# Patient Record
Sex: Female | Born: 1955 | Race: White | Hispanic: No | Marital: Married | Smoking: Never smoker
Health system: Southern US, Community
[De-identification: ages and names within clinical notes are randomized; demographics above are authoritative.]

## PROBLEM LIST (undated history)

## (undated) DIAGNOSIS — M1990 Unspecified osteoarthritis, unspecified site: Secondary | ICD-10-CM

## (undated) DIAGNOSIS — T8859XA Other complications of anesthesia, initial encounter: Secondary | ICD-10-CM

## (undated) DIAGNOSIS — T4145XA Adverse effect of unspecified anesthetic, initial encounter: Secondary | ICD-10-CM

## (undated) DIAGNOSIS — R112 Nausea with vomiting, unspecified: Secondary | ICD-10-CM

## (undated) DIAGNOSIS — R011 Cardiac murmur, unspecified: Secondary | ICD-10-CM

## (undated) DIAGNOSIS — Z9889 Other specified postprocedural states: Secondary | ICD-10-CM

## (undated) DIAGNOSIS — I10 Essential (primary) hypertension: Secondary | ICD-10-CM

## (undated) HISTORY — PX: NOSE SURGERY: SHX723

## (undated) HISTORY — DX: Essential (primary) hypertension: I10

## (undated) HISTORY — PX: ABDOMINAL HYSTERECTOMY: SHX81

## (undated) HISTORY — DX: Unspecified osteoarthritis, unspecified site: M19.90

## (undated) HISTORY — DX: Cardiac murmur, unspecified: R01.1

---
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
## 1983-04-15 HISTORY — PX: DILATION AND CURETTAGE OF UTERUS: SHX78

## 1995-04-15 HISTORY — PX: BACK SURGERY: SHX140

## 1996-06-12 HISTORY — PX: SPINE SURGERY: SHX786

## 1999-04-24 ENCOUNTER — Ambulatory Visit (HOSPITAL_COMMUNITY): Admission: RE | Admit: 1999-04-24 | Discharge: 1999-04-24 | Payer: Self-pay | Admitting: Obstetrics and Gynecology

## 1999-04-24 ENCOUNTER — Encounter: Payer: Self-pay | Admitting: Obstetrics and Gynecology

## 1999-11-25 ENCOUNTER — Other Ambulatory Visit: Admission: RE | Admit: 1999-11-25 | Discharge: 1999-11-25 | Payer: Self-pay | Admitting: Obstetrics and Gynecology

## 2000-04-14 LAB — HM MAMMOGRAPHY: HM Mammogram: NORMAL

## 2000-08-11 ENCOUNTER — Other Ambulatory Visit: Admission: RE | Admit: 2000-08-11 | Discharge: 2000-08-11 | Payer: Self-pay | Admitting: Obstetrics and Gynecology

## 2001-09-12 ENCOUNTER — Encounter (INDEPENDENT_AMBULATORY_CARE_PROVIDER_SITE_OTHER): Payer: Self-pay | Admitting: Internal Medicine

## 2001-09-12 LAB — CONVERTED CEMR LAB: Pap Smear: NORMAL

## 2001-11-23 ENCOUNTER — Other Ambulatory Visit: Admission: RE | Admit: 2001-11-23 | Discharge: 2001-11-23 | Payer: Self-pay | Admitting: Obstetrics and Gynecology

## 2003-12-14 ENCOUNTER — Other Ambulatory Visit: Admission: RE | Admit: 2003-12-14 | Discharge: 2003-12-14 | Payer: Self-pay | Admitting: Obstetrics and Gynecology

## 2004-08-09 ENCOUNTER — Ambulatory Visit: Payer: Self-pay | Admitting: Family Medicine

## 2004-08-19 ENCOUNTER — Ambulatory Visit: Payer: Self-pay | Admitting: Family Medicine

## 2004-09-18 ENCOUNTER — Ambulatory Visit: Payer: Self-pay | Admitting: Family Medicine

## 2004-10-25 ENCOUNTER — Ambulatory Visit: Payer: Self-pay | Admitting: Family Medicine

## 2005-04-24 ENCOUNTER — Ambulatory Visit: Payer: Self-pay | Admitting: Family Medicine

## 2005-06-16 ENCOUNTER — Ambulatory Visit: Payer: Self-pay | Admitting: Family Medicine

## 2005-06-30 ENCOUNTER — Ambulatory Visit: Payer: Self-pay | Admitting: Family Medicine

## 2005-07-17 ENCOUNTER — Ambulatory Visit: Payer: Self-pay | Admitting: Family Medicine

## 2005-07-28 ENCOUNTER — Other Ambulatory Visit: Admission: RE | Admit: 2005-07-28 | Discharge: 2005-07-28 | Payer: Self-pay | Admitting: Obstetrics and Gynecology

## 2007-11-04 ENCOUNTER — Telehealth (INDEPENDENT_AMBULATORY_CARE_PROVIDER_SITE_OTHER): Payer: Self-pay | Admitting: Internal Medicine

## 2007-12-15 ENCOUNTER — Telehealth (INDEPENDENT_AMBULATORY_CARE_PROVIDER_SITE_OTHER): Payer: Self-pay | Admitting: Internal Medicine

## 2007-12-15 ENCOUNTER — Encounter (INDEPENDENT_AMBULATORY_CARE_PROVIDER_SITE_OTHER): Payer: Self-pay | Admitting: Internal Medicine

## 2007-12-15 DIAGNOSIS — I10 Essential (primary) hypertension: Secondary | ICD-10-CM | POA: Insufficient documentation

## 2007-12-28 ENCOUNTER — Ambulatory Visit: Payer: Self-pay | Admitting: Family Medicine

## 2007-12-31 ENCOUNTER — Ambulatory Visit: Payer: Self-pay | Admitting: Family Medicine

## 2008-01-03 ENCOUNTER — Ambulatory Visit: Payer: Self-pay | Admitting: Family Medicine

## 2008-01-03 DIAGNOSIS — M719 Bursopathy, unspecified: Secondary | ICD-10-CM

## 2008-01-03 DIAGNOSIS — M19019 Primary osteoarthritis, unspecified shoulder: Secondary | ICD-10-CM | POA: Insufficient documentation

## 2008-01-03 DIAGNOSIS — M67919 Unspecified disorder of synovium and tendon, unspecified shoulder: Secondary | ICD-10-CM | POA: Insufficient documentation

## 2008-01-04 LAB — CONVERTED CEMR LAB
ALT: 27 units/L (ref 0–35)
AST: 27 units/L (ref 0–37)
Albumin: 4 g/dL (ref 3.5–5.2)
Alkaline Phosphatase: 46 units/L (ref 39–117)
BUN: 13 mg/dL (ref 6–23)
Basophils Absolute: 0.1 10*3/uL (ref 0.0–0.1)
Basophils Relative: 1.3 % (ref 0.0–3.0)
Bilirubin, Direct: 0.1 mg/dL (ref 0.0–0.3)
CO2: 28 meq/L (ref 19–32)
Calcium: 9.2 mg/dL (ref 8.4–10.5)
Chloride: 109 meq/L (ref 96–112)
Cholesterol: 176 mg/dL (ref 0–200)
Creatinine, Ser: 0.7 mg/dL (ref 0.4–1.2)
Direct LDL: 105.5 mg/dL
Eosinophils Absolute: 0.3 10*3/uL (ref 0.0–0.7)
Eosinophils Relative: 4.9 % (ref 0.0–5.0)
GFR calc Af Amer: 114 mL/min
GFR calc non Af Amer: 94 mL/min
Glucose, Bld: 93 mg/dL (ref 70–99)
HCT: 35.5 % — ABNORMAL LOW (ref 36.0–46.0)
HDL: 36.8 mg/dL — ABNORMAL LOW (ref >39.0)
Hemoglobin: 12.2 g/dL (ref 12.0–15.0)
Lymphocytes Relative: 26.4 % (ref 12.0–46.0)
MCHC: 34.2 g/dL (ref 30.0–36.0)
MCV: 85.4 fL (ref 78.0–100.0)
Monocytes Absolute: 0.4 10*3/uL (ref 0.1–1.0)
Monocytes Relative: 7.5 % (ref 3.0–12.0)
Neutro Abs: 3.4 10*3/uL (ref 1.4–7.7)
Neutrophils Relative %: 59.9 % (ref 43.0–77.0)
Platelets: 281 10*3/uL (ref 150–400)
Potassium: 4.5 meq/L (ref 3.5–5.1)
RBC: 4.16 M/uL (ref 3.87–5.11)
RDW: 12.1 % (ref 11.5–14.6)
Sodium: 141 meq/L (ref 135–145)
TSH: 0.48 microintl units/mL (ref 0.35–5.50)
Total Bilirubin: 0.7 mg/dL (ref 0.3–1.2)
Total CHOL/HDL Ratio: 4.8
Total Protein: 6.9 g/dL (ref 6.0–8.3)
Triglycerides: 204 mg/dL (ref 0–149)
VLDL: 41 mg/dL — ABNORMAL HIGH (ref 0–40)
WBC: 5.7 10*3/uL (ref 4.5–10.5)

## 2008-02-08 ENCOUNTER — Ambulatory Visit: Payer: Self-pay | Admitting: Family Medicine

## 2008-06-27 ENCOUNTER — Ambulatory Visit: Payer: Self-pay | Admitting: Family Medicine

## 2008-06-27 ENCOUNTER — Encounter (INDEPENDENT_AMBULATORY_CARE_PROVIDER_SITE_OTHER): Payer: Self-pay | Admitting: *Deleted

## 2008-10-30 ENCOUNTER — Ambulatory Visit: Payer: Self-pay | Admitting: Family Medicine

## 2008-10-30 DIAGNOSIS — L551 Sunburn of second degree: Secondary | ICD-10-CM | POA: Insufficient documentation

## 2009-01-02 ENCOUNTER — Ambulatory Visit: Payer: Self-pay | Admitting: Family Medicine

## 2009-01-02 DIAGNOSIS — J309 Allergic rhinitis, unspecified: Secondary | ICD-10-CM | POA: Insufficient documentation

## 2009-01-12 ENCOUNTER — Telehealth: Payer: Self-pay | Admitting: Family Medicine

## 2009-01-31 ENCOUNTER — Encounter (INDEPENDENT_AMBULATORY_CARE_PROVIDER_SITE_OTHER): Payer: Self-pay | Admitting: *Deleted

## 2009-03-23 ENCOUNTER — Ambulatory Visit: Payer: Self-pay | Admitting: Family Medicine

## 2009-03-23 DIAGNOSIS — J069 Acute upper respiratory infection, unspecified: Secondary | ICD-10-CM | POA: Insufficient documentation

## 2010-01-22 ENCOUNTER — Telehealth: Payer: Self-pay | Admitting: Family Medicine

## 2010-01-28 ENCOUNTER — Telehealth: Payer: Self-pay | Admitting: Family Medicine

## 2010-05-16 NOTE — Progress Notes (Signed)
Summary: wants motion sickness patches  Phone Note Call from Patient Call back at Work Phone [phone]   Caller: Patient Summary of Call: Pt is going deep sea fishing and is asking for transderm scop patches to be [facility].  She only wants one patch, but I told her they come in a box of 4. Initial call taken by: Lowella Petties CMA,  January 22, 2010 3:05 PM  Follow-up for Phone Call        done Follow-up by: Crawford Givens MD,  January 22, 2010 3:27 PM    New/Updated Medications: TRANSDERM-SCOP 1.5 MG PT72 (SCOPOLAMINE BASE) 1 patch applied q72 hours for motion sickness Prescriptions: TRANSDERM-SCOP 1.5 MG PT72 (SCOPOLAMINE BASE) 1 patch applied q72 hours for motion sickness  #4 patches x 0   Entered and Authorized by:   Crawford Givens MD   Signed by:   Crawford Givens MD on 01/22/2010   Method used:   [facility]        [facility] [address] [facility]       [address]       [place], [place]  [address]       Ph: [phone] or [phone]       Fax: [phone]   RxID:   [id number]

## 2010-05-16 NOTE — Progress Notes (Signed)
Summary: Rx Lisinopril  Phone Note Refill Request Message from:  Fax from Pharmacy on January 28, 2010 9:20 AM  Refills Requested: Medication #1:  LISINOPRIL 10 MG TABS Take one by mouth daily   Supply Requested: 1 month PATIENT NOT SEEN IN OFFICE IN ALMOST A YEAR   [facility]   Method Requested: Electronic Initial call taken by: Benny Lennert CMA Duncan Dull),  January 28, 2010 9:21 AM  Follow-up for Phone Call        Have patient scheduled CPE here in the clinic.   Follow-up by: Crawford Givens MD,  January 28, 2010 9:38 AM  Additional Follow-up for Phone Call Additional follow up Details #1::        Patient notified as instructed by telephone. Transferred patient to the front office to get an  appointment scheduled. Sydell Axon LPN  January 28, 2010 12:01 PM  Additional Follow-up by: Sydell Axon LPN,  January 28, 2010 12:06 PM    Prescriptions: LISINOPRIL 10 MG TABS (LISINOPRIL) Take one by mouth daily  #90 x 0   Entered and Authorized by:   Crawford Givens MD   Signed by:   Crawford Givens MD on 01/28/2010   Method used:   [facility]        [facility] [address] [facility]       [address]       [place], [place]  [address]       Ph: [phone] or [phone]       Fax: [phone]   RxID:   [id number]

## 2010-06-05 ENCOUNTER — Ambulatory Visit (INDEPENDENT_AMBULATORY_CARE_PROVIDER_SITE_OTHER): Payer: BC Managed Care – PPO | Admitting: Family Medicine

## 2010-06-05 ENCOUNTER — Encounter: Payer: Self-pay | Admitting: Family Medicine

## 2010-06-05 DIAGNOSIS — Z1211 Encounter for screening for malignant neoplasm of colon: Secondary | ICD-10-CM

## 2010-06-05 DIAGNOSIS — M1990 Unspecified osteoarthritis, unspecified site: Secondary | ICD-10-CM

## 2010-06-05 DIAGNOSIS — I10 Essential (primary) hypertension: Secondary | ICD-10-CM

## 2010-06-11 NOTE — Assessment & Plan Note (Signed)
Summary: TRANSFER FROM [facility]  [employer]   Vital Signs:  Patient profile:   55 year old female Height:      63.5 inches Weight:      174.75 pounds BMI:     30.58 Temp:     98.0 degrees F oral Pulse rate:   66 / minute Pulse rhythm:   regular BP sitting:   140 / 70  (left arm) Cuff size:   regular  Vitals Entered By: Linde Gillis CMA Duncan Dull) (June 05, 2010 3:24 PM) CC: establish care from [place]   History of Present Illness: Hypertension:      Using medication without problems or lightheadedness: yes Chest pain with exertion:no Edema:no Short of breath:no Average home BPs: 120s SBP recently Other issues: no.  no cough.    OA- h/o back surgery.  Taking nsaids with food and this controls the pain.  Occ shoulder pain, L shoulder, at night.  She usually takes the diclofenac once daily, occ two times a day.  She occ uses ibuprofen for the shoulder pain, but she doesn't use the meds together.  No recent injury.  I talked to her about minimizing the nsaids and the caution re:ACE use.    Allergies (verified): No Known Drug Allergies  Past History:  Past Medical History: Hypertension Osteoarthritis- controlled with diclofenac, usually with single dose per day  Past Surgical History: D&C due to misscarriage--1985 septioplasty--1990 back surg  L5-S1--3/98   [facility] Bladder tack and hysterectomy planned- Dr. Senaida Ores  Family History: Reviewed history from 12/15/2007 and no changes required. father: D) age 82  MI, crohn's mother: D)  age 70 MI 0 brothers 0 sisters MGM D) MI, CAD Depression positive in father, mother, PGF  Social History: Reviewed history from 12/28/2007 and no changes required. Marital Status: Married, 1980 Children: 3--1 at home, no grandchildren Occupation: Youth worker, Medical laboratory scientific officer, works as Lawyer at [employer] no smoking no alcohol  no illicits enjoys gardening, fishing  Review of Systems       See HPI.   Otherwise negative.    Physical Exam  General:  GEN: nad, alert and oriented HEENT: mucous membranes moist NECK: supple w/o LA CV: rrr.  no murmur PULM: ctab, no inc wob ABD: soft, +bs EXT: no edema SKIN: no acute rash  L shoulder with normal ROM but some pain along the superior aspect of the scapula posteriorly, esp with ext/int rotation.    Impression & Recommendations:  Problem # 1:  OSTEOARTHRITIS (ICD-715.90) >25 min spent with patient, at least half of which was spent on counseling.  GI caution and ACE caution given.  return for labs and notify me if the pain increases.   Her updated medication list for this problem includes:    Diclofenac Sodium 75 Mg Tbec (Diclofenac sodium) .Marland Kitchen... Take 1 tablet by mouth two times a day as needed with food  Problem # 2:  HYPERTENSION (ICD-401.9) controlled, d/w patient [id number]/FAOZ.  Flu shot encouraged, declined.  Her updated medication list for this problem includes:    Lisinopril 10 Mg Tabs (Lisinopril) .Marland Kitchen... Take one by mouth daily  Problem # 3:  SCREENING, COLON CANCER (ICD-V76.51) d/w patient [id number] and she opts for IFOB.  No FH of colon CA.    Problem # 4:  Screening Breast Cancer (ICD-V76.10) per Gyn along with pap.   Complete Medication List: 1)  Lisinopril 10 Mg Tabs (Lisinopril) .... Take one by mouth daily 2)  Diclofenac Sodium 75 Mg Tbec (Diclofenac sodium) .Marland KitchenMarland KitchenMarland Kitchen  Take 1 tablet by mouth two times a day as needed with food  Patient Instructions: 1)  I was glad to see you today.  2)  Come back for fasting labs.  CMET/lipid 401.1.  You can get your results through our phone system.  Follow the instructions on the blue card.  3)  Office visit in 6months to check BP.  4)  I would get a flu shot.   5)  Take care.  Stay on your low salt diet and try to walk more for exercise.  Prescriptions: DICLOFENAC SODIUM 75 MG TBEC (DICLOFENAC SODIUM) Take 1 tablet by mouth two times a day as needed with food  #180 x 3    Entered and Authorized by:   Crawford Givens MD   Signed by:   Crawford Givens MD on 06/05/2010   Method used:   [facility]        [facility] [facility]       [address]       [place], [place]  [address]       Ph: [phone] or [phone]       Fax: [phone]   RxID:   [id number] LISINOPRIL 10 MG TABS (LISINOPRIL) Take one by mouth daily  #90 x 3   Entered and Authorized by:   Crawford Givens MD   Signed by:   Crawford Givens MD on 06/05/2010   Method used:   [facility]ronically [facility]        [facility] ([facility]ail)       [address]       [place], [place]  [address]       Ph: [phone] or [phone]       Fax: [phone]   RxID:   [id number]    Orders Added: 1)  Est. Patient Level IV [616[address]]    Current Allergies (reviewed today): No known allergies

## 2010-06-11 NOTE — Letter (Signed)
Summary: [facility]: Immunoassay Fecal Occult Blood (iFOB) Order Form  [place] [facility]  [address] [place], [place] [address]   Phone: [phone]  Fax: [phone]      [facility]: Immunoassay Fecal Occult Blood (iFOB) Order Form   June 05, 2010 MRN: [id number]   Miranda Willis Jan 11, 1956   Physicican Name:______duncan___________________  Diagnosis Code:________v76.49__________________      Crawford Givens MD

## 2010-06-12 ENCOUNTER — Encounter (INDEPENDENT_AMBULATORY_CARE_PROVIDER_SITE_OTHER): Payer: Self-pay | Admitting: *Deleted

## 2010-06-12 ENCOUNTER — Other Ambulatory Visit: Payer: Self-pay | Admitting: Family Medicine

## 2010-06-12 ENCOUNTER — Other Ambulatory Visit: Payer: BC Managed Care – PPO

## 2010-06-12 DIAGNOSIS — Z1289 Encounter for screening for malignant neoplasm of other sites: Secondary | ICD-10-CM

## 2010-06-13 ENCOUNTER — Encounter (INDEPENDENT_AMBULATORY_CARE_PROVIDER_SITE_OTHER): Payer: Self-pay | Admitting: *Deleted

## 2010-06-13 LAB — FECAL OCCULT BLOOD, IMMUNOCHEMICAL: Fecal Occult Bld: NEGATIVE

## 2010-06-20 NOTE — Letter (Signed)
Summary: Results Follow up Letter  [place] [facility]  [address] [place], [place] [address]   Phone: [phone]  Fax: [phone]    06/13/2010 MRN: [id number]    Miranda Willis [address] [facility] [place], [place]  [address]  [place]    Dear Ms. Reitz,  The following are the results of your recent test(s):  Test         Result    Pap Smear:        Normal _____  Not Normal _____ Comments: ______________________________________________________ Cholesterol: LDL(Bad cholesterol):         Your goal is less than:         HDL (Good cholesterol):       Your goal is more than: Comments:  ______________________________________________________ Mammogram:        Normal _____  Not Normal _____ Comments:  ___________________________________________________________________ Hemoccult:        Normal ___X__  Not normal _______ Comments:    _____________________________________________________________________ Other Tests:    We routinely do not discuss normal results over the telephone.  If you desire a copy of the results, or you have any questions about this information we can discuss them at your next office visit.   Sincerely,    Dwana Curd. Para March, M.D.  [facility]

## 2010-08-24 ENCOUNTER — Encounter: Payer: Self-pay | Admitting: Family Medicine

## 2010-08-24 ENCOUNTER — Encounter: Payer: Self-pay | Admitting: Internal Medicine

## 2010-08-24 ENCOUNTER — Ambulatory Visit (INDEPENDENT_AMBULATORY_CARE_PROVIDER_SITE_OTHER): Payer: BC Managed Care – PPO | Admitting: Internal Medicine

## 2010-08-24 VITALS — BP 180/90 | HR 80 | Temp 98.3°F | Ht 63.5 in | Wt 171.0 lb

## 2010-08-24 DIAGNOSIS — H8190 Unspecified disorder of vestibular function, unspecified ear: Secondary | ICD-10-CM

## 2010-08-24 DIAGNOSIS — R42 Dizziness and giddiness: Secondary | ICD-10-CM

## 2010-08-24 DIAGNOSIS — Z23 Encounter for immunization: Secondary | ICD-10-CM

## 2010-08-24 MED ORDER — MECLIZINE HCL 25 MG PO TABS: 25.0000 mg | ORAL_TABLET | Freq: Three times a day (TID) | ORAL | Status: AC | PRN

## 2010-08-24 NOTE — Progress Notes (Signed)
  Subjective:    Patient ID: Miranda Willis, female    DOB: 1955/08/13, 55 y.o.   MRN: [id number]  HPI Awoke this AM, "staggered to bathroom" Having dizziness and nausea Okay if lying down but can't really stand up Vomited several times this AM  Balance is off No sense of movement  No headache No fever Mild nasal and ear congestion Does have allergy problems---tried zyrtec this Am but no help  No tinnitus No decrease in hearing  Current outpatient prescriptions:cetirizine (ZYRTEC) 10 MG tablet, OTC as directed , Disp: , Rfl: ;  diclofenac (VOLTAREN) 75 MG EC tablet, Take 75 mg by mouth 2 (two) times daily. With food , Disp: , Rfl: ;  lisinopril (PRINIVIL,ZESTRIL) 10 MG tablet, Take 10 mg by mouth daily.  , Disp: , Rfl:   Past Medical History  Diagnosis Date  . Hypertension   . Osteoarthritis     Past Surgical History  Procedure Date  . Dilation and curettage of uterus 1985    due to miscarriage  . Spine surgery 06/1996    back surgery L5-S1 Dr Criss Alvine    Family History  Problem Relation Age of Onset  . Heart disease Mother     MI  . Depression Mother   . Heart disease Father     MI  . Depression Father   . Heart disease Maternal Grandmother     MI and CAD  . Depression Paternal Grandfather     History   Social History  . Marital Status: Married    Spouse Name: N/A    Number of Children: N/A  . Years of Education: N/A   Occupational History  . Not on file.   Social History Main Topics  . Smoking status: Never Smoker   . Smokeless tobacco: Not on file  . Alcohol Use: No  . Drug Use: No  . Sexually Active: Not on file   Other Topics Concern  . Not on file   Social History Narrative  . No narrative on file   Review of Systems No speech trouble No weakness or other neurologic symptoms Has had similar spells about 1 per year---better in past if she just stays in bed Not prone to motion sickness    Objective:   Physical Exam  Constitutional: She  is oriented to person, place, and time. She appears well-developed and well-nourished.       Lying on exam table Mildly uncomfortable--esp with movement  HENT:  Head: Normocephalic and atraumatic.  Right Ear: External ear normal.  Left Ear: External ear normal.  Mouth/Throat: Oropharynx is clear and moist. No oropharyngeal exudate.       TMs normal  Neck: Normal range of motion. No thyromegaly present.  Lymphadenopathy:    She has no cervical adenopathy.  Neurological: She is alert and oriented to person, place, and time. She has normal strength. She displays no tremor. No cranial nerve deficit or sensory deficit. She exhibits normal muscle tone. Gait abnormal.       Very mild swaying with walking  Psychiatric: She has a normal mood and affect. Her behavior is normal. Judgment and thought content normal.          Assessment & Plan:

## 2010-08-26 ENCOUNTER — Telehealth: Payer: Self-pay | Admitting: *Deleted

## 2010-08-26 NOTE — Telephone Encounter (Signed)
Noted.  Pt seen in Saturday clinic, thought to be vestibular cause of vertigo.

## 2010-08-26 NOTE — Telephone Encounter (Signed)
[facility] Triage Call Report Triage Record Num: [id number] Operator: Elita Boone Patient Name: Miranda Willis Call Date & Time: 08/24/2010 9:47:05AM Patient Phone: [phone] PCP: Crawford Givens Patient Gender: Female PCP Fax : Patient DOB: [birth year] Practice Name: [facility] Reason for Call: pt calling with report of dizzness and nausa. Pt reports it is only when she stands up. Pt reports that she is feeling congestion in ears. Onset 05/11. Pt reports that she took "allergy pill. " Pt given an appt today at [facility] office by Erie Noe at 1045. Home care advice given for dizziness. No emergent s/s. Protocol(s) Used: Dizziness or Vertigo Recommended Outcome per Protocol: See Provider within 4 hours Reason for Outcome: Vertigo with vomiting AND not responding to 4 hours of home care Care Advice: Call EMS 911 if patient develops confusion, decreased level of consciousness, chest pain, shortness of breath, or focal neurologic abnormalities such as facial droop or weakness of one extremity. ~ ~ DO NOT drive until condition evaluated. ~ Call provider if symptoms worsen or new symptoms develop. Avoid caffeine (coffee, tea, cola drinks, or chocolate), alcohol, and nicotine (use of tobacco), as use of these substances may worsen symptoms. ~ ~ SYMPTOM / CONDITION MANAGEMENT ~ CAUTIONS ~ List, or take, all current prescription(s), nonprescription or alternative medication(s) to provider for evaluation. Vomiting Care Advice: - Do not eat solid foods until vomiting subsides. - Begin taking fluids by sucking on ice chips or popsicles or taking sips of cool clear, nonprescription oral rehydration solution). - Gradually drink larger amounts of these fluids so that you are drinking six to eight 8 oz. (.2 liter) of fluids a day. - Keep activity to a minimum. - After vomiting subsides, eat smaller, more frequent meals of easily digested foods such as crackers, toast, bananas, rice, cooked  cereal, applesauce, broth, baked or mashed potatoes, chicken or [place] without skin. Eat slowly. - Take fluids 30 minutes before or 60 minutes after meals. - Avoid high fat, highly seasoned, high fiber or high sugar content foods. - Avoid extremely hot or cold foods. - Do not take pain medication (such as aspirin, NSAIDs) while nauseated or vomiting. - Consult your provider for advice regarding continuing prescription medication. - Rest as much as possible in a sitting or in a propped lying position. Do not lie flat for at least 2 hours after eating. ~ 08/24/2010 9:57:06AM Page 1 of 1 [id number]

## 2010-10-15 ENCOUNTER — Encounter: Payer: Self-pay | Admitting: Family Medicine

## 2010-10-15 ENCOUNTER — Ambulatory Visit (INDEPENDENT_AMBULATORY_CARE_PROVIDER_SITE_OTHER): Payer: BC Managed Care – PPO | Admitting: Family Medicine

## 2010-10-15 DIAGNOSIS — M25519 Pain in unspecified shoulder: Secondary | ICD-10-CM

## 2010-10-15 DIAGNOSIS — I10 Essential (primary) hypertension: Secondary | ICD-10-CM

## 2010-10-15 DIAGNOSIS — M25512 Pain in left shoulder: Secondary | ICD-10-CM | POA: Insufficient documentation

## 2010-10-15 MED ORDER — LISINOPRIL 10 MG PO TABS: 10.0000 mg | ORAL_TABLET | Freq: Every day | ORAL | Status: DC

## 2010-10-15 MED ORDER — DICLOFENAC SODIUM 75 MG PO TBEC: 75.0000 mg | DELAYED_RELEASE_TABLET | Freq: Every day | ORAL | Status: DC

## 2010-10-15 NOTE — Patient Instructions (Signed)
Call ortho about the appointment.  Don't change your meds.  Come back for fasting labs.  You can get your results through our phone system a few days after you get labs drawn.  Follow the instructions on the blue card. I would get a flu shot each fall.   Take care.

## 2010-10-15 NOTE — Assessment & Plan Note (Signed)
Controlled, return for labs.  No change in meds.

## 2010-10-15 NOTE — Progress Notes (Signed)
New Grandmother, Madison Hickman, born ~1.5 weeks ago.  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   I talked to her about getting a flu shot this fall.   Hypertension:    Using medication without problems or lightheadedness: yes Chest pain with exertion:no Edema:no Short of breath:no  She's due for labs, not fasting today.   OA: Still with shoulder pain.  S/p injection and PT for R shoulder, improved.  L shoulder and B elbow pain continues.  She has some intermittent position numbness in the L arm.  She's prev seen [facility]ia [facility]' office with ortho.  She'll call about that.  Taking diclofenac once a day and tolerated well.    Meds, vitals, and allergies reviewed.   ROS: See HPI.  Otherwise negative.    GEN: nad, alert and oriented NECK: supple w/o LA CV: rrr. PULM: ctab, no inc wob ABD: soft, +bs EXT: no edema SKIN: no acute rash L shoulder with normal ROM but pain on int rotation and + impingement

## 2010-10-15 NOTE — Assessment & Plan Note (Signed)
She'll call about f/u with ortho.  No change in meds in the meantime.

## 2010-10-17 ENCOUNTER — Other Ambulatory Visit (INDEPENDENT_AMBULATORY_CARE_PROVIDER_SITE_OTHER): Payer: BC Managed Care – PPO | Admitting: Family Medicine

## 2010-10-17 DIAGNOSIS — I10 Essential (primary) hypertension: Secondary | ICD-10-CM

## 2010-10-17 LAB — LIPID PANEL
HDL: 49 mg/dL (ref >39.00)
Total CHOL/HDL Ratio: 4
VLDL: 36.2 mg/dL (ref 0.0–40.0)

## 2010-10-17 LAB — COMPREHENSIVE METABOLIC PANEL
ALT: 24 U/L (ref 0–35)
AST: 22 U/L (ref 0–37)
Alkaline Phosphatase: 52 U/L (ref 39–117)
Potassium: 4 mEq/L (ref 3.5–5.1)
Sodium: 137 mEq/L (ref 135–145)
Total Bilirubin: 0.3 mg/dL (ref 0.3–1.2)
Total Protein: 7.2 g/dL (ref 6.0–8.3)

## 2010-11-29 ENCOUNTER — Ambulatory Visit (INDEPENDENT_AMBULATORY_CARE_PROVIDER_SITE_OTHER): Payer: BC Managed Care – PPO | Admitting: Family Medicine

## 2010-11-29 ENCOUNTER — Encounter: Payer: Self-pay | Admitting: Family Medicine

## 2010-11-29 VITALS — BP 140/80 | HR 80 | Temp 99.5°F | Wt 172.8 lb

## 2010-11-29 DIAGNOSIS — R21 Rash and other nonspecific skin eruption: Secondary | ICD-10-CM | POA: Insufficient documentation

## 2010-11-29 MED ORDER — FLUOCINONIDE 0.05 % EX CREA: TOPICAL_CREAM | CUTANEOUS | Status: DC

## 2010-11-29 MED ORDER — PERMETHRIN 5 % EX CREA: TOPICAL_CREAM | CUTANEOUS | Status: DC

## 2010-11-29 NOTE — Patient Instructions (Signed)
Scabies   Scabies are small bugs (mites) that burrow under the skin and cause red bumps and severe itching. These bugs can only be seen with a microscope.   Scabies are highly contagious. They can spread easily from person to person by direct contact. They are also spread through sharing clothing or linens that have the scabies mites living in them. It is not unusual for an entire family to become infected through shared towels, clothing, or bedding.   HOME CARE INSTRUCTIONS   Your caregiver may prescribe a cream or lotion to kill the mites. If this cream is prescribed; massage the cream into the entire area of the body from the neck to the bottom of both feet. Also massage the cream into the scalp and face if your child is less than 1 year old. Avoid the eyes and mouth.   Leave the cream on for 8 to12 hours. DO NOT wash your hands after application. Your child should bathe or shower after the 8 to 12 hour application period. Sometimes it is helpful to apply the cream to your child at right before bedtime.   One treatment is usually effective and will eliminate approximately 95% of infestations. For severe cases, your caregiver may decide to repeat the treatment in 1 week. Everyone in your household should be treated with one application of the cream.   New rashes or burrows should not appear after successful treatment within 24 to 48 hours; however the itching and rash may last for 2 to 4 weeks after successful treatment. If your symptoms persist longer than this, see your caregiver.   Your caregiver also may prescribe a medication to help with the itching or to help the rash go away more quickly.   Scabies can live on clothing or linens for up to 3 days. Your entire child’s recently used clothing, towels, stuffed toys, and bed linens should be washed in hot water and then dried in a dryer for at least 20 minutes on high heat. Items that cannot be washed should be enclosed in a plastic bag for at least 3 days.   To  help relieve itching, bathe your child in a COOL bath or apply cool washcloths to the affected areas.   Your child may return to school after treatment with the prescribed cream.   SEEK MEDICAL CARE IF:   The itching persists longer than 4 weeks after treatment.   The rash spreads or becomes infected (the area has red blisters or yellow-tan crust).   Document Released: 03/31/2005 Document Re-Released: 08/17/2008   ExitCare® Patient Information ©2011 [employer].

## 2010-11-29 NOTE — Progress Notes (Signed)
  Subjective:    Patient ID: Miranda Willis, female    DOB: 05/29/1955, 55 y.o.   MRN: [id number]  HPI  55 yo here for rash on right leg and stomach. Was in the garden a few days ago and started itching that night. Claritin and Zyrtec not helping.  Does have dogs at home.  Husband does not have any of these symptoms. No recent travel. No change in laundry detergent.  Rash is very itchy, not painful, feels like it is spreading up her right side of her body.  Patient Active Problem List  Diagnoses  . HYPERTENSION  . ALLERGIC RHINITIS  . DEGENERATIVE JOINT DISEASE, RIGHT SHOULDER  . ROTATOR CUFF SYNDROME, RIGHT  . OSTEOARTHRITIS  . Shoulder pain, left   Past Medical History  Diagnosis Date  . Hypertension   . Osteoarthritis    Past Surgical History  Procedure Date  . Dilation and curettage of uterus 1985    due to miscarriage  . Spine surgery 06/1996    back surgery L5-S1 Dr Criss Alvine  . Nose surgery    History  Substance Use Topics  . Smoking status: Never Smoker   . Smokeless tobacco: Never Used  . Alcohol Use: No   Family History  Problem Relation Age of Onset  . Heart disease Mother     MI  . Depression Mother   . Heart disease Father     MI  . Depression Father   . Heart disease Maternal Grandmother     MI and CAD  . Depression Paternal Grandfather    No Known Allergies Current Outpatient Prescriptions on File Prior to Visit  Medication Sig Dispense Refill  . cetirizine (ZYRTEC) 10 MG tablet OTC as directed       . diclofenac (VOLTAREN) 75 MG EC tablet Take 1 tablet (75 mg total) by mouth daily. With food  90 tablet  3  . lisinopril (PRINIVIL,ZESTRIL) 10 MG tablet Take 1 tablet (10 mg total) by mouth daily.  90 tablet  3      Review of Systems See HPI    Objective:   Physical Exam BP 140/80  Pulse 80  Temp 99.5 F (37.5 C)  Wt 172 lb 12 oz (78.359 kg) Gen:  Alert, pleasant, NAD HEENT: MMM Skin:  +erythematous papules on right leg, waist line  and back, no burrow markings Psych:  Alert, oriented, not anxious or depressed appearing     Assessment & Plan:   1. Rash    New.  Does seem consistent with scabies or allergic reaction to bug bites but no mites or burrow marks. Will treat with one dose permethrin, lidex cream as needed. Benadryl or Zyrtec as needed.

## 2011-03-12 ENCOUNTER — Telehealth: Payer: Self-pay | Admitting: *Deleted

## 2011-03-12 NOTE — Telephone Encounter (Signed)
LMOVM to return call.

## 2011-03-12 NOTE — Telephone Encounter (Signed)
Had had virus since Monday.  Does she need appt?  She is sick on stomach and can't eat a lot.  Please call patient back.

## 2011-03-12 NOTE — Telephone Encounter (Signed)
Pt c/o nausea since Monday, diarrhea Monday, and vomiting Monday and again today after eating.  She is taking Tyl for fever and took Claritin last night for sinus sx, both are helping. I advised clear liquids for 24 hours then slowly progress to [employer], and that I would check with you for any additional advise on otc meds etc.

## 2011-03-13 NOTE — Telephone Encounter (Signed)
LMOVM to return call.

## 2011-03-17 ENCOUNTER — Ambulatory Visit (INDEPENDENT_AMBULATORY_CARE_PROVIDER_SITE_OTHER): Payer: BC Managed Care – PPO | Admitting: Family Medicine

## 2011-03-17 ENCOUNTER — Encounter: Payer: Self-pay | Admitting: Family Medicine

## 2011-03-17 VITALS — BP 146/78 | HR 84 | Temp 99.5°F | Wt 173.2 lb

## 2011-03-17 DIAGNOSIS — J069 Acute upper respiratory infection, unspecified: Secondary | ICD-10-CM

## 2011-03-17 MED ORDER — GUAIFENESIN-CODEINE 100-10 MG/5ML PO SYRP: 5.0000 mL | ORAL_SOLUTION | Freq: Two times a day (BID) | ORAL | Status: AC | PRN

## 2011-03-17 NOTE — Assessment & Plan Note (Signed)
Of 1 d duration, not consistent with ILI. Anticipate last week illness viral gastro as vomiting resolved. Supportive care, cheratussin for cough.  Update [place] if sxs not improving.

## 2011-03-17 NOTE — Progress Notes (Signed)
  Subjective:    Patient ID: Miranda Willis, female    DOB: [birth year], 55 y.o.   MRN: [id number]  HPI CC: chest congestion  Last week had nausea/vomiting, fever 7days ago.  Stayed out of work for 3 days.  Returned to work today.  Now starting to have chest congestion, coughing productive of clear mucous, sharp HA which started yesterday.  Also with PNDrainage.  Fever/chills, subjective at home.  Diarrhea started as well yesterday.  Some ear pressure.  Cough keeping her up at night.  Blowing nose productive of clear mucous.  Progressive onset of sxs.  Tried mucinex, delsym and robitussin, and alleve.  No abd pain, rashes, tooth pain, chest pain or shortness of breath.  Works as Lawyer.  Wears mask.  + sick contacts at work.  No smoking at home.  No h/o COPD, asthma.  Review of Systems Per HPI    Objective:   Physical Exam  Nursing note and vitals reviewed. Constitutional: She appears well-developed and well-nourished. No distress.       coughing  HENT:  Head: Normocephalic and atraumatic.  Right Ear: Hearing, tympanic membrane, external ear and ear canal normal.  Left Ear: Hearing, tympanic membrane, external ear and ear canal normal.  Nose: No mucosal edema or rhinorrhea. Right sinus exhibits no maxillary sinus tenderness and no frontal sinus tenderness. Left sinus exhibits no maxillary sinus tenderness and no frontal sinus tenderness.  Mouth/Throat: Uvula is midline, oropharynx is clear and moist and mucous membranes are normal. No oropharyngeal exudate, posterior oropharyngeal edema, posterior oropharyngeal erythema or tonsillar abscesses.  Eyes: Conjunctivae and EOM are normal. Pupils are equal, round, and reactive to light. No scleral icterus.  Neck: Normal range of motion. Neck supple. No JVD present. No thyromegaly present.  Cardiovascular: Normal rate, regular rhythm, normal heart sounds and intact distal pulses.   No murmur heard. Pulmonary/Chest: Effort normal and breath sounds  normal. No respiratory distress. She has no wheezes. She has no rales.  Lymphadenopathy:    She has no cervical adenopathy.  Skin: Skin is warm and dry. No rash noted.       Assessment & Plan:

## 2011-03-17 NOTE — Patient Instructions (Signed)
Sounds like you have a viral upper respiratory infection. Antibiotics are not needed for this.  Viral infections usually take 7-10 days to resolve.  The cough can last several weeks to go away. Use medication as prescribed: cheratussin for cough at night.   Out of work until feeling better. Push fluids and plenty of rest. Please return if you are not improving as expected, or if you have high fevers (>101.5) or difficulty swallowing or worsening productive cough. Call clinic with questions.  Good to see you today.  Buy thermometer.

## 2011-08-01 ENCOUNTER — Encounter: Payer: Self-pay | Admitting: Family Medicine

## 2011-08-01 ENCOUNTER — Ambulatory Visit (INDEPENDENT_AMBULATORY_CARE_PROVIDER_SITE_OTHER): Payer: BC Managed Care – PPO | Admitting: Family Medicine

## 2011-08-01 VITALS — BP 122/78 | HR 82 | Temp 99.0°F | Wt 167.0 lb

## 2011-08-01 DIAGNOSIS — J029 Acute pharyngitis, unspecified: Secondary | ICD-10-CM | POA: Insufficient documentation

## 2011-08-01 MED ORDER — AMOXICILLIN 875 MG PO TABS: 875.0000 mg | ORAL_TABLET | Freq: Two times a day (BID) | ORAL | Status: AC

## 2011-08-01 NOTE — Patient Instructions (Addendum)
Start the amoxil today and gargle with salt water for your throat.  Out of work in meantime.

## 2011-08-01 NOTE — Assessment & Plan Note (Signed)
Presumed strep with exudates and tender LA.  Treat and f/u prn.

## 2011-08-01 NOTE — Progress Notes (Signed)
duration of symptoms: 2 days Rhinorrhea: yes congestion:yes ear pain:yes, B sore throat:yes, white spots on tonsils.  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  No fevers known.   RST neg.   ROS: See HPI.  Otherwise negative.    Meds, vitals, and allergies reviewed.   GEN: nad, alert and oriented HEENT: mucous membranes moist, TM w/o erythema, nasal epithelium not injected, OP with cobblestoning and B tonsillar exudates.  NECK: supple w/ tender LA CV: rrr. PULM: ctab, no inc wob ABD: soft, +bs EXT: no edema

## 2011-11-06 ENCOUNTER — Other Ambulatory Visit: Payer: Self-pay | Admitting: Family Medicine

## 2012-03-22 ENCOUNTER — Other Ambulatory Visit: Payer: Self-pay | Admitting: Family Medicine

## 2012-03-22 DIAGNOSIS — M545 Low back pain, unspecified: Secondary | ICD-10-CM

## 2012-03-27 ENCOUNTER — Ambulatory Visit
Admission: RE | Admit: 2012-03-27 | Discharge: 2012-03-27 | Disposition: A | Discharge status: Home / Self Care | Payer: BC Managed Care – PPO | Source: Ambulatory Visit | Attending: Family Medicine | Admitting: Family Medicine

## 2012-03-27 DIAGNOSIS — M545 Low back pain, unspecified: Secondary | ICD-10-CM

## 2012-10-28 ENCOUNTER — Other Ambulatory Visit: Payer: Self-pay

## 2012-10-28 DIAGNOSIS — Z1231 Encounter for screening mammogram for malignant neoplasm of breast: Secondary | ICD-10-CM

## 2012-11-15 ENCOUNTER — Other Ambulatory Visit: Payer: Self-pay | Admitting: Family Medicine

## 2012-11-15 NOTE — Telephone Encounter (Signed)
Sent, schedule a CPE.  Thanks.  

## 2012-11-15 NOTE — Telephone Encounter (Signed)
Electronic refill request. Patient has not been seen since 07/2011.  Please advise.

## 2012-11-16 NOTE — Telephone Encounter (Signed)
Patient advised.   Transferred to [facility] for scheduling.

## 2012-11-19 ENCOUNTER — Ambulatory Visit
Admission: RE | Admit: 2012-11-19 | Discharge: 2012-11-19 | Disposition: A | Discharge status: Home / Self Care | Payer: BC Managed Care – PPO | Source: Ambulatory Visit

## 2012-11-19 DIAGNOSIS — Z1231 Encounter for screening mammogram for malignant neoplasm of breast: Secondary | ICD-10-CM

## 2012-12-21 ENCOUNTER — Telehealth: Payer: Self-pay

## 2012-12-21 NOTE — Telephone Encounter (Signed)
Pt spoke with Revonda Standard at front desk requesting Lisinopril refill to [facility]. In transferring call; call was lost. [facility] pt has available refills and will get Lisinopril ready for pt. Pt's work # has been disconnected and no answer; left v/m for [facility].

## 2012-12-22 ENCOUNTER — Ambulatory Visit (INDEPENDENT_AMBULATORY_CARE_PROVIDER_SITE_OTHER): Payer: BC Managed Care – PPO | Admitting: Family Medicine

## 2012-12-22 ENCOUNTER — Encounter: Payer: Self-pay | Admitting: Family Medicine

## 2012-12-22 VITALS — BP 138/80 | HR 80 | Temp 98.3°F | Wt 169.5 lb

## 2012-12-22 DIAGNOSIS — I10 Essential (primary) hypertension: Secondary | ICD-10-CM

## 2012-12-22 DIAGNOSIS — Z1211 Encounter for screening for malignant neoplasm of colon: Secondary | ICD-10-CM

## 2012-12-22 DIAGNOSIS — Z23 Encounter for immunization: Secondary | ICD-10-CM

## 2012-12-22 MED ORDER — LISINOPRIL 10 MG PO TABS: ORAL_TABLET | ORAL | Status: DC

## 2012-12-22 NOTE — Progress Notes (Signed)
Hypertension:   Using medication without problems or lightheadedness: yes Chest pain with exertion:no Edema:no Short of breath:no Diet and exercise d/w pt.  Most exercise is at work.  Encouraged walking and less soda.    Due for tetanus.   Mammogram up to date.  Has f/u with gyn in 10/14.  D/w patient [id number] for colon cancer screening, including IFOB vs. colonoscopy.  Risks and benefits of both were discussed and patient voiced understanding.  Pt elects [id number].   Meds, vitals, and allergies reviewed.   PMH and SH reviewed  ROS: See HPI.  Otherwise negative.    GEN: nad, alert and oriented HEENT: mucous membranes moist NECK: supple w/o LA CV: rrr. PULM: ctab, no inc wob ABD: soft, +bs EXT: no edema SKIN: no acute rash

## 2012-12-22 NOTE — Assessment & Plan Note (Signed)
Return for labs, continue current meds.  Diet and exercise d/w pt.

## 2012-12-22 NOTE — Patient Instructions (Addendum)
Take care.  Come back for fasting labs.  Get the stool cards on the way out today.  Glad to see you.

## 2013-04-14 LAB — HM PAP SMEAR

## 2013-06-10 ENCOUNTER — Encounter: Payer: Self-pay | Admitting: Family Medicine

## 2013-06-10 ENCOUNTER — Ambulatory Visit (INDEPENDENT_AMBULATORY_CARE_PROVIDER_SITE_OTHER): Payer: BC Managed Care – PPO | Admitting: Family Medicine

## 2013-06-10 VITALS — BP 142/68 | HR 80 | Temp 98.6°F | Wt 176.5 lb

## 2013-06-10 DIAGNOSIS — J019 Acute sinusitis, unspecified: Secondary | ICD-10-CM

## 2013-06-10 MED ORDER — AMOXICILLIN-POT CLAVULANATE 875-125 MG PO TABS: 1.0000 | ORAL_TABLET | Freq: Two times a day (BID) | ORAL | Status: DC

## 2013-06-10 NOTE — Progress Notes (Signed)
Pre visit review using our clinic review tool, if applicable. No additional management support is needed unless otherwise documented below in the visit note.  Started about 1 month ago.  Had a cough and postnasal gtt.  Some sinus pressure and pain. Was taking guaifenesin at that point.  She would try to work through the week, rest on the weekend, and then get back to work. No with upper tooth pain, ear pain, both worse on the L side.  Still fatigued.  Sick exposures at work.  No fevers now, likely had some prev. Not SOB.   Meds, vitals, and allergies reviewed.   ROS: See HPI.  Otherwise, noncontributory.  GEN: nad, alert and oriented HEENT: mucous membranes moist, tm w/o erythema, nasal exam w/o erythema, clear discharge noted,  OP with cobblestoning, L max sinuses ttp NECK: supple w/o LA CV: rrr.   PULM: ctab, no inc wob EXT: no edema

## 2013-06-10 NOTE — Patient Instructions (Signed)
Start the antibiotics today.  Use zyrtec as needed.  Take care.  Try to get some rest.  Drink plenty of fluids.

## 2013-06-10 NOTE — Assessment & Plan Note (Signed)
Start augmentin, continue zyrtec and f/u prn. Nontoxic.

## 2013-10-05 ENCOUNTER — Telehealth: Payer: Self-pay

## 2013-10-05 MED ORDER — ETODOLAC 400 MG PO TABS: 400.0000 mg | ORAL_TABLET | Freq: Two times a day (BID) | ORAL | Status: DC | PRN

## 2013-10-05 NOTE — Telephone Encounter (Signed)
Patient notified as instructed by telephone. Lab appointment scheduled.

## 2013-10-05 NOTE — Telephone Encounter (Signed)
Left message on answering machine to call back.

## 2013-10-05 NOTE — Telephone Encounter (Signed)
Sent. She's due for f/u labs.  Future orders are in. Thanks.

## 2013-10-05 NOTE — Telephone Encounter (Signed)
Verify the dose and sig and I'll send it in.  Thanks.

## 2013-10-05 NOTE — Telephone Encounter (Signed)
Pt left v/m; pt received Etodolac for arthritis from Dr Junius Roads; pt no longer sees Dr Junius Roads and pt has discussed with Dr Damita Dunnings about refilling Etod[facility]ac to [facility].Please advise.

## 2013-10-05 NOTE — Telephone Encounter (Signed)
Spoke to patient and was advised that she takes Etodolac 400 mg two times a day as needed. Advised patient to check with her pharmacy later today. Added to medication list.

## 2013-10-07 ENCOUNTER — Other Ambulatory Visit (INDEPENDENT_AMBULATORY_CARE_PROVIDER_SITE_OTHER): Payer: BC Managed Care – PPO

## 2013-10-07 DIAGNOSIS — I10 Essential (primary) hypertension: Secondary | ICD-10-CM

## 2013-10-07 LAB — BASIC METABOLIC PANEL
BUN: 12 mg/dL (ref 6–23)
CHLORIDE: 108 meq/L (ref 96–112)
CO2: 27 mEq/L (ref 19–32)
Calcium: 9.5 mg/dL (ref 8.4–10.5)
Creatinine, Ser: 0.8 mg/dL (ref 0.4–1.2)
GFR: 83.45 mL/min (ref >60.00)
GLUCOSE: 110 mg/dL — AB (ref 70–99)
POTASSIUM: 4.3 meq/L (ref 3.5–5.1)
Sodium: 141 mEq/L (ref 135–145)

## 2013-10-07 LAB — LIPID PANEL
CHOLESTEROL: 161 mg/dL (ref 0–200)
HDL: 40.8 mg/dL (ref >39.00)
LDL CALC: 71 mg/dL (ref 0–99)
NonHDL: 120.2
Total CHOL/HDL Ratio: 4
Triglycerides: 247 mg/dL — ABNORMAL HIGH (ref 0.0–149.0)
VLDL: 49.4 mg/dL — AB (ref 0.0–40.0)

## 2013-10-09 ENCOUNTER — Encounter: Payer: Self-pay | Admitting: Family Medicine

## 2013-10-09 DIAGNOSIS — R739 Hyperglycemia, unspecified: Secondary | ICD-10-CM | POA: Insufficient documentation

## 2013-10-09 DIAGNOSIS — E781 Pure hyperglyceridemia: Secondary | ICD-10-CM | POA: Insufficient documentation

## 2013-10-18 ENCOUNTER — Telehealth: Payer: Self-pay

## 2013-10-18 DIAGNOSIS — L989 Disorder of the skin and subcutaneous tissue, unspecified: Secondary | ICD-10-CM

## 2013-10-18 NOTE — Telephone Encounter (Signed)
Ordered. Thanks

## 2013-10-18 NOTE — Telephone Encounter (Signed)
Pt left v/m requesting referral to dermatologist; pt has several moles wants to be checked by dermatologist.

## 2013-11-17 ENCOUNTER — Encounter: Payer: Self-pay | Admitting: Family Medicine

## 2013-11-17 ENCOUNTER — Ambulatory Visit (INDEPENDENT_AMBULATORY_CARE_PROVIDER_SITE_OTHER): Payer: BC Managed Care – PPO | Admitting: Family Medicine

## 2013-11-17 VITALS — BP 120/70 | HR 78 | Temp 98.4°F | Wt 177.0 lb

## 2013-11-17 DIAGNOSIS — J069 Acute upper respiratory infection, unspecified: Secondary | ICD-10-CM

## 2013-11-17 MED ORDER — BENZONATATE 200 MG PO CAPS: 200.0000 mg | ORAL_CAPSULE | Freq: Three times a day (TID) | ORAL | Status: DC | PRN

## 2013-11-17 MED ORDER — FLUTICASONE PROPIONATE 50 MCG/ACT NA SUSP: 2.0000 | Freq: Every day | NASAL | Status: DC

## 2013-11-17 NOTE — Progress Notes (Signed)
Pre visit review using our clinic review tool, if applicable. No additional management support is needed unless otherwise documented below in the visit note.  Sx started about 2-3 days ago, worse yesterday.  Stuffy, HA, ST, head pressure.  Fever.  Vomited.  Fatigued.  Cough, some sputum.    Meds, vitals, and allergies reviewed.   ROS: See HPI.  Otherwise, noncontributory.  GEN: nad, alert and oriented HEENT: mucous membranes moist, tm w/o erythema, nasal exam w/o erythema, clear discharge noted,  OP with cobblestoning NECK: supple w/o LA CV: rrr.   PULM: ctab, no inc wob EXT: no edema SKIN: no acute rash

## 2013-11-17 NOTE — Patient Instructions (Signed)
Likely a virus.  Use tessalon for the cough and the flonase for the nasal symptoms.  This should gradually get better.  Take care.

## 2013-11-18 DIAGNOSIS — J0110 Acute frontal sinusitis, unspecified: Secondary | ICD-10-CM | POA: Insufficient documentation

## 2013-11-18 DIAGNOSIS — J019 Acute sinusitis, unspecified: Secondary | ICD-10-CM | POA: Insufficient documentation

## 2013-11-18 NOTE — Assessment & Plan Note (Signed)
Nontoxic, likely viral.  Use tessalon for the cough and the flonase for the nasal symptoms.  F/u prn. Supportive care.  She agrees.

## 2013-11-21 ENCOUNTER — Encounter: Payer: Self-pay | Admitting: Family Medicine

## 2013-11-21 ENCOUNTER — Ambulatory Visit (INDEPENDENT_AMBULATORY_CARE_PROVIDER_SITE_OTHER): Payer: BC Managed Care – PPO | Admitting: Family Medicine

## 2013-11-21 ENCOUNTER — Telehealth: Payer: Self-pay | Admitting: Family Medicine

## 2013-11-21 VITALS — BP 146/64 | HR 81 | Temp 98.3°F | Wt 180.0 lb

## 2013-11-21 DIAGNOSIS — J018 Other acute sinusitis: Secondary | ICD-10-CM

## 2013-11-21 MED ORDER — HYDROCODONE-HOMATROPINE 5-1.5 MG/5ML PO SYRP: 5.0000 mL | ORAL_SOLUTION | Freq: Three times a day (TID) | ORAL | Status: DC | PRN

## 2013-11-21 MED ORDER — AMOXICILLIN-POT CLAVULANATE 875-125 MG PO TABS: 1.0000 | ORAL_TABLET | Freq: Two times a day (BID) | ORAL | Status: DC

## 2013-11-21 NOTE — Progress Notes (Signed)
Pre visit review using our clinic review tool, if applicable. No additional management support is needed unless otherwise documented below in the visit note.  Prev note reviewed, started flonase in the meantime with minimal relief.  Still with cough, worse at night.  Can't rest, sleeping on the couch.  No fevers but felt hot. HA.  Facial pain.  Some better with ibuprofen.  More sinus pressure.  Sputum production, green. Post nasal gtt.   Meds, vitals, and allergies reviewed.   ROS: See HPI.  Otherwise, noncontributory.  GEN: nad, alert and oriented HEENT: mucous membranes moist, tm w/o erythema, nasal exam w/o erythema, purulent discharge noted,  OP with cobblestoning, Max>frontal ttp, purulent postnasal gtt. NECK: supple w/o LA CV: rrr.   PULM: ctab, no inc wob EXT: no edema SKIN: no acute rash

## 2013-11-21 NOTE — Patient Instructions (Signed)
Start the augmentin and use the cough medicine as needed. It can make you drowsy.  Take care.  Try to get some rest.  Glad to see you.

## 2013-11-21 NOTE — Telephone Encounter (Signed)
Patient Information:  Caller Name: Miranda Willis  Phone: [phone]  Patient: Miranda Willis, Miranda Willis  Gender: Female  DOB: Dec 08, 1955  Age: 58 Years  PCP: Elsie Stain Brigitte Pulse) [facility])  Office Follow Up:  Does the office need to follow up with this patient?: No  Instructions For The Office: N/A   Symptoms  Reason For Call & Symptoms: Pt is calling and states that she has cough, headache, congestion and nasal drainage is unbearable; was seen in the office on 11/17/13 and was instructed that she has a virus; but she is no better today than she was on Thursday; requesting an antibotic or an appt for today;  Reviewed Health History In EMR: Yes  Reviewed Medications In EMR: Yes  Reviewed Allergies In EMR: Yes  Reviewed Surgeries / Procedures: Yes  Date of Onset of Symptoms: 11/16/2013  Treatments Tried: Robitussin; Flonase;  Treatments Tried Worked: No  Any Fever: Yes  Fever Taken: Tactile  Fever Time Of Reading: 08:00:00  Fever Last Reading: N/A  Guideline(s) Used:  Sinus Pain and Congestion  Disposition Per Guideline:   See Today in Office  Reason For Disposition Reached:   Sinus pain (not just congestion) and fever  Advice Given:  N/A  Patient Will Follow Care Advice:  YES  Appointment Scheduled:  11/21/2013 14:15:00 Appointment Scheduled Provider:  Elsie Stain Brigitte Pulse) [facility])

## 2013-11-22 NOTE — Assessment & Plan Note (Signed)
Augmentin, f/u prn. Nontoxic. Hycodan prn cough.  She agrees.

## 2014-01-08 ENCOUNTER — Other Ambulatory Visit: Payer: Self-pay | Admitting: Family Medicine

## 2014-04-09 ENCOUNTER — Other Ambulatory Visit: Payer: Self-pay | Admitting: Family Medicine

## 2014-04-14 HISTORY — PX: EYE SURGERY: SHX253

## 2014-05-17 ENCOUNTER — Other Ambulatory Visit: Payer: Self-pay | Admitting: Family Medicine

## 2014-06-10 ENCOUNTER — Other Ambulatory Visit: Payer: Self-pay | Admitting: Family Medicine

## 2014-06-12 NOTE — Telephone Encounter (Signed)
Please send in when she schedules a CPE.  Thanks.

## 2014-06-12 NOTE — Telephone Encounter (Signed)
Electronic refill request.   Patient was notified at last refill that appt needed to be made.  She was seen in August 2015 for an acute visit.  Please advise. Last Filled:    30 tablet 0 05/17/2014

## 2014-06-13 NOTE — Telephone Encounter (Signed)
Patient is agreeable to scheduling CPE.  Medication refills sent.  Notice sent to [facility] to schedule CPE.

## 2014-06-15 ENCOUNTER — Telehealth: Payer: Self-pay | Admitting: Family Medicine

## 2014-06-15 NOTE — Telephone Encounter (Signed)
Tried call pt work number.  This is a fax number.  Tried call pt at home phone rang  No answering machine   Please schedule CPE. Patient asks that you please call her at her work number.

## 2014-06-16 NOTE — Telephone Encounter (Signed)
Lab 6/8 cpx 6/14 Pt aware

## 2014-08-08 ENCOUNTER — Encounter: Payer: Self-pay | Admitting: Family Medicine

## 2014-08-08 ENCOUNTER — Ambulatory Visit (INDEPENDENT_AMBULATORY_CARE_PROVIDER_SITE_OTHER): Payer: BC Managed Care – PPO | Admitting: Family Medicine

## 2014-08-08 VITALS — BP 142/70 | HR 82 | Temp 99.9°F | Wt 176.2 lb

## 2014-08-08 DIAGNOSIS — J0110 Acute frontal sinusitis, unspecified: Secondary | ICD-10-CM

## 2014-08-08 MED ORDER — HYDROCODONE-HOMATROPINE 5-1.5 MG/5ML PO SYRP: 5.0000 mL | ORAL_SOLUTION | Freq: Three times a day (TID) | ORAL | Status: DC | PRN

## 2014-08-08 MED ORDER — FLUTICASONE PROPIONATE 50 MCG/ACT NA SUSP: 2.0000 | Freq: Every day | NASAL | Status: DC

## 2014-08-08 MED ORDER — AMOXICILLIN-POT CLAVULANATE 875-125 MG PO TABS: 1.0000 | ORAL_TABLET | Freq: Two times a day (BID) | ORAL | Status: DC

## 2014-08-08 NOTE — Patient Instructions (Signed)
Use the cough medicine if needed.  Restart flonase and start augmentin. Take care.  Glad to see you.  Try to get some rest.  Drink plenty of fluids.

## 2014-08-08 NOTE — Assessment & Plan Note (Signed)
Nontoxic, start augmentin, use flonase and hycodan.  Sedation caution on hycodan.  F/u prn.  She agrees.

## 2014-08-08 NOTE — Progress Notes (Signed)
Pre visit review using our clinic review tool, if applicable. No additional management support is needed unless otherwise documented below in the visit note.  Was sick at [place], got some better, did well for a few weeks.  Now sick again for the last week.  Fatigued.  Cough, more today with some sputum.  Facial pain.  Post nasal gtt.  HA.  Clearing her throat.  Irritated throat.  Ran out of flonase.  HA less when she takes a shower.  She had some leftover hycodan and took it at night with some relief.  No ADE on med.  Taking mucinex.    Meds, vitals, and allergies reviewed.   ROS: See HPI.  Otherwise, noncontributory.  GEN: nad, alert and oriented HEENT: mucous membranes moist, tm w/o erythema, nasal exam w/o erythema, clear discharge noted,  OP with cobblestoning, frontal sinuses ttp B NECK: supple w/o LA CV: rrr.   PULM: ctab, no inc wob EXT: no edema

## 2014-08-26 ENCOUNTER — Other Ambulatory Visit: Payer: Self-pay | Admitting: Family Medicine

## 2014-09-06 ENCOUNTER — Other Ambulatory Visit: Payer: Self-pay | Admitting: Family Medicine

## 2014-09-06 DIAGNOSIS — I10 Essential (primary) hypertension: Secondary | ICD-10-CM

## 2014-09-20 ENCOUNTER — Other Ambulatory Visit: Payer: BC Managed Care – PPO

## 2014-09-26 ENCOUNTER — Other Ambulatory Visit: Payer: Self-pay | Admitting: Family Medicine

## 2014-09-26 ENCOUNTER — Encounter: Payer: BC Managed Care – PPO | Admitting: Family Medicine

## 2014-11-24 ENCOUNTER — Other Ambulatory Visit (INDEPENDENT_AMBULATORY_CARE_PROVIDER_SITE_OTHER): Payer: BC Managed Care – PPO

## 2014-11-24 DIAGNOSIS — I10 Essential (primary) hypertension: Secondary | ICD-10-CM

## 2014-11-24 LAB — COMPREHENSIVE METABOLIC PANEL
ALBUMIN: 4.3 g/dL (ref 3.5–5.2)
ALK PHOS: 57 U/L (ref 39–117)
ALT: 24 U/L (ref 0–35)
AST: 20 U/L (ref 0–37)
BILIRUBIN TOTAL: 0.5 mg/dL (ref 0.2–1.2)
BUN: 15 mg/dL (ref 6–23)
CO2: 27 mEq/L (ref 19–32)
Calcium: 9.7 mg/dL (ref 8.4–10.5)
Chloride: 105 mEq/L (ref 96–112)
Creatinine, Ser: 0.77 mg/dL (ref 0.40–1.20)
GFR: 81.87 mL/min (ref >60.00)
Glucose, Bld: 130 mg/dL — ABNORMAL HIGH (ref 70–99)
POTASSIUM: 4.2 meq/L (ref 3.5–5.1)
Sodium: 139 mEq/L (ref 135–145)
Total Protein: 7.1 g/dL (ref 6.0–8.3)

## 2014-11-24 LAB — LIPID PANEL
CHOL/HDL RATIO: 5
CHOLESTEROL: 173 mg/dL (ref 0–200)
HDL: 37 mg/dL — ABNORMAL LOW (ref >39.00)
NONHDL: 135.57
Triglycerides: 220 mg/dL — ABNORMAL HIGH (ref 0.0–149.0)
VLDL: 44 mg/dL — ABNORMAL HIGH (ref 0.0–40.0)

## 2014-11-24 LAB — LDL CHOLESTEROL, DIRECT: Direct LDL: 110 mg/dL

## 2014-11-28 ENCOUNTER — Ambulatory Visit (INDEPENDENT_AMBULATORY_CARE_PROVIDER_SITE_OTHER): Payer: BC Managed Care – PPO | Admitting: Family Medicine

## 2014-11-28 ENCOUNTER — Encounter: Payer: Self-pay | Admitting: Family Medicine

## 2014-11-28 ENCOUNTER — Other Ambulatory Visit: Payer: Self-pay | Admitting: Family Medicine

## 2014-11-28 VITALS — BP 112/72 | HR 75 | Temp 98.4°F | Ht 64.0 in | Wt 176.2 lb

## 2014-11-28 DIAGNOSIS — Z1211 Encounter for screening for malignant neoplasm of colon: Secondary | ICD-10-CM

## 2014-11-28 DIAGNOSIS — R739 Hyperglycemia, unspecified: Secondary | ICD-10-CM | POA: Diagnosis not present

## 2014-11-28 DIAGNOSIS — Z0000 Encounter for general adult medical examination without abnormal findings: Secondary | ICD-10-CM | POA: Diagnosis not present

## 2014-11-28 DIAGNOSIS — Z119 Encounter for screening for infectious and parasitic diseases, unspecified: Secondary | ICD-10-CM

## 2014-11-28 DIAGNOSIS — M1990 Unspecified osteoarthritis, unspecified site: Secondary | ICD-10-CM | POA: Diagnosis not present

## 2014-11-28 DIAGNOSIS — I10 Essential (primary) hypertension: Secondary | ICD-10-CM

## 2014-11-28 DIAGNOSIS — Z7189 Other specified counseling: Secondary | ICD-10-CM

## 2014-11-28 LAB — HEMOGLOBIN A1C: Hgb A1c MFr Bld: 6.2 % (ref 4.6–6.5)

## 2014-11-28 MED ORDER — LISINOPRIL 10 MG PO TABS: 10.0000 mg | ORAL_TABLET | Freq: Every day | ORAL | Status: DC

## 2014-11-28 MED ORDER — ETODOLAC 400 MG PO TABS: ORAL_TABLET | ORAL | Status: DC

## 2014-11-28 NOTE — Patient Instructions (Addendum)
You can call for a mammogram at: [place] [phone]  Cut out the pepsi drinks.   Go to the lab on the way out.  We'll contact you with your lab report. Take care. Glad to see you.

## 2014-11-28 NOTE — Progress Notes (Signed)
Pre visit review using our clinic review tool, if applicable. No additional management support is needed unless otherwise documented below in the visit note.  CPE- See plan.  Routine anticipatory guidance given to patient.  See health maintenance. Tetanus 2014 Flu encouraged.  Declined.   PNA not due Shingles not due Pap done last year at gyn clinic.  D/w patient [id number] for colon cancer screening, including IFOB vs. colonoscopy.  Risks and benefits of both were discussed and patient voiced understanding.  Pt elects PHX:[id number].   DXA not due.   Mammogram due, d/w pt.   Living will d/w pt.  Would have her husband designated if patient were incapacitated.   Diet and exercises d/w pt. Too many sodas per patient.  D/w pt about options, ie more water.  "Nothing for exercise."  Encouraged.  D/w pt.    Hyperglycemia.  A1c pending.  See above re: diet and exercise.   Hypertension:    Using medication without problems or lightheadedness: yes Chest pain with exertion:no Edema:no Short of breath:no  OA.  Continued back pain.  Lodine helps.  Rare use of ibuprofen, not concurrent with lodine.  D/w pt.  No new sx.  AM stiffness in back noted.    PMH and SH reviewed  Meds, vitals, and allergies reviewed.   ROS: See HPI.  Otherwise negative.    GEN: nad, alert and oriented HEENT: mucous membranes moist NECK: supple w/o LA CV: rrr. PULM: ctab, no inc wob ABD: soft, +bs EXT: no edema SKIN: no acute rash

## 2014-11-29 DIAGNOSIS — Z0000 Encounter for general adult medical examination without abnormal findings: Secondary | ICD-10-CM | POA: Insufficient documentation

## 2014-11-29 DIAGNOSIS — Z7189 Other specified counseling: Secondary | ICD-10-CM | POA: Insufficient documentation

## 2014-11-29 LAB — HEPATITIS C ANTIBODY: HCV AB: NEGATIVE

## 2014-11-29 LAB — HIV ANTIBODY (ROUTINE TESTING W REFLEX): HIV 1&2 Ab, 4th Generation: NONREACTIVE

## 2014-11-29 NOTE — Assessment & Plan Note (Signed)
Needs work on diet and weight, d/w pt about labs and low carb diet, DM2 path/phys.  See notes on labs.

## 2014-11-29 NOTE — Assessment & Plan Note (Signed)
Controlled, needs work on diet and weight, d/w pt about labs.  Continue as is with med.

## 2014-11-29 NOTE — Assessment & Plan Note (Addendum)
Routine anticipatory guidance given to patient. See health maintenance.  Tetanus 2014  Flu encouraged. Declined.  PNA not due  Shingles not due  Pap done last year at gyn clinic.  D/w patient [id number] for colon cancer screening, including IFOB vs. colonoscopy. Risks and benefits of both were discussed and patient voiced understanding. Pt elects [id number].  DXA not due.  Mammogram due, d/w pt.  Living will d/w pt. Would have her husband designated if patient were incapacitated.  Diet and exercises d/w pt. Too many sodas per patient. D/w pt about options, ie more water. "Nothing for exercise." Encouraged. D/w pt.  HIV and HCV screening d/w pt. She wants screening.

## 2014-11-29 NOTE — Assessment & Plan Note (Signed)
Weight reduction would help. D/w pt.  Continue with lodine.  This is manageable as is.  She agrees.

## 2015-01-08 ENCOUNTER — Other Ambulatory Visit (INDEPENDENT_AMBULATORY_CARE_PROVIDER_SITE_OTHER): Payer: BC Managed Care – PPO

## 2015-01-08 DIAGNOSIS — Z1211 Encounter for screening for malignant neoplasm of colon: Secondary | ICD-10-CM | POA: Diagnosis not present

## 2015-01-08 LAB — FECAL OCCULT BLOOD, IMMUNOCHEMICAL: Fecal Occult Bld: NEGATIVE

## 2015-07-26 ENCOUNTER — Ambulatory Visit (INDEPENDENT_AMBULATORY_CARE_PROVIDER_SITE_OTHER): Payer: BC Managed Care – PPO | Admitting: Internal Medicine

## 2015-07-26 ENCOUNTER — Encounter: Payer: Self-pay | Admitting: Internal Medicine

## 2015-07-26 DIAGNOSIS — J020 Streptococcal pharyngitis: Secondary | ICD-10-CM | POA: Diagnosis not present

## 2015-07-26 LAB — POCT RAPID STREP A (OFFICE): Rapid Strep A Screen: POSITIVE — AB

## 2015-07-26 MED ORDER — AMOXICILLIN 500 MG PO CAPS: 500.0000 mg | ORAL_CAPSULE | Freq: Three times a day (TID) | ORAL | Status: DC

## 2015-07-26 NOTE — Progress Notes (Signed)
Subjective:    Patient ID: Miranda Willis, female    DOB: 1955-06-02, 60 y.o.   MRN: [id number]  HPI  Pt presents to the clinictoday with c/o sore throat and subjective fever. This started yesterday. She has had some difficulty swallowing. She denies runny nose, nasal congestion or cough. She has tried Tylenol with minimal relief. Her daughter and granddaughter were both diagnosed with strep throat in the last 2 days.  Review of Systems  Past Medical History  Diagnosis Date  . Hypertension   . Osteoarthritis     Current Outpatient Prescriptions  Medication Sig Dispense Refill  . etodolac (LODINE) 400 MG tablet TAKE 1 TABLET (400 MG TOTAL) BY MOUTH 2 (TWO) TIMES DAILY AS NEEDED (FOR PAIN. TAKE WITH FOOD.). 60 tablet 12  . fluticasone (FLONASE) 50 MCG/ACT nasal spray Place 2 sprays into both nostrils daily. 16 g 3  . lisinopril (PRINIVIL,ZESTRIL) 10 MG tablet Take 1 tablet (10 mg total) by mouth daily. 30 tablet 12  . amoxicillin (AMOXIL) 500 MG capsule Take 1 capsule (500 mg total) by mouth 3 (three) times daily. 30 capsule 0   No current facility-administered medications for this visit.    No Known Allergies  Family History  Problem Relation Age of Onset  . Heart disease Mother     MI  . Depression Mother   . Heart disease Father     MI  . Depression Father   . Heart disease Maternal Grandmother     MI and CAD  . Depression Paternal Grandfather   . Colon cancer Neg Hx   . Breast cancer Neg Hx     Social History   Social History  . Marital Status: Married    Spouse Name: N/A  . Number of Children: N/A  . Years of Education: N/A   Occupational History  . Not on file.   Social History Main Topics  . Smoking status: Never Smoker   . Smokeless tobacco: Never Used  . Alcohol Use: No  . Drug Use: No  . Sexual Activity: Not on file   Other Topics Concern  . 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      Constitutional: Subjective fever. Denies malaise, fatigue, headache or abrupt weight changes.  HEENT: Positive sore throat Denies eye pain, eye redness, runny nose, nasal congestion, bloody nose. Respiratory: Denies difficulty breathing, shortness of breath, or cough.   Cardiovascular: Denies chest pain, chest tightness, or palpitations.    No other specific complaints in a complete review of systems (except as listed in HPI above).     Objective:   Physical Exam BP 126/68 mmHg  Pulse 92  Temp(Src) 99.6 F (37.6 C) (Oral)  Wt 156 lb 8 oz (70.988 kg)  SpO2 96% Wt Readings from Last 3 Encounters:  07/26/15 156 lb 8 oz (70.988 kg)  11/28/14 176 lb 4 oz (79.946 kg)  08/08/14 176 lb 4 oz (79.946 kg)    General: Appears her stated age, in NAD. Skin: Warm, dry and intact. No rashes  noted. HEENT: Head: normal shape and size; Eyes: sclera white, no icterus, conjunctiva pink; Ears: Tm's gray and intact, normal light reflex; Throat/Mouth: Teeth present, mucosa erythematous and mois with bilateral tonsillar exudate noted. Neck: Cervical lymphadenopathy noted. Cardiovascular: Normal rate and rhythm. ? murmur Pulmonary/Chest: Normal effort and positive vesicular  breath sounds. No respiratory distress. No wheezes, rales or ronchi noted.   BMET    Component Value Date/Time   NA 139 11/24/2014 0809   K 4.2 11/24/2014 0809   CL 105 11/24/2014 0809   CO2 27 11/24/2014 0809   GLUCOSE 130* 11/24/2014 0809   BUN 15 11/24/2014 0809   CREATININE 0.77 11/24/2014 0809   CALCIUM 9.7 11/24/2014 0809   GFRNONAA 94 12/31/2007 0937   GFRAA 114 12/31/2007 0937    Lipid Panel     Component Value Date/Time   CHOL 173 11/24/2014 0809   TRIG 220.0* 11/24/2014 0809   HDL 37.00* 11/24/2014 0809   CHOLHDL 5 11/24/2014 0809   VLDL 44.0* 11/24/2014 0809   LDLCALC 71 10/07/2013 0735    CBC    Component Value Date/Time   WBC 5.7 12/31/2007 0937   RBC  4.16 12/31/2007 0937   HGB 12.2 12/31/2007 0937   HCT 35.5* 12/31/2007 0937   PLT 281 12/31/2007 0937   MCV 85.4 12/31/2007 0937   MCHC 34.2 12/31/2007 0937   RDW 12.1 12/31/2007 0937   MONOABS 0.4 12/31/2007 0937   EOSABS 0.3 12/31/2007 0937   BASOSABS 0.1 12/31/2007 0937    Hgb A1C Lab Results  Component Value Date   HGBA1C 6.2 11/28/2014       Assessment & Plan:   Strep Pharyngitis:  Rapid Strep: positive eRx for Amoxicillin 500 mg TID x10 days Continue Tylenol for fever Salt water gargles for comfort  RTC if sxs worsen

## 2015-07-26 NOTE — Progress Notes (Signed)
Pre visit review using our clinic review tool, if applicable. No additional management support is needed unless otherwise documented below in the visit note. 

## 2015-07-26 NOTE — Patient Instructions (Signed)

## 2015-12-02 ENCOUNTER — Other Ambulatory Visit: Payer: Self-pay | Admitting: Family Medicine

## 2015-12-02 NOTE — Telephone Encounter (Signed)
Due for CPE.  Sent.  Thanks.

## 2015-12-03 NOTE — Telephone Encounter (Signed)
Left detailed message on voicemail.  

## 2016-01-23 ENCOUNTER — Ambulatory Visit (INDEPENDENT_AMBULATORY_CARE_PROVIDER_SITE_OTHER): Payer: BC Managed Care – PPO | Admitting: Family Medicine

## 2016-01-23 ENCOUNTER — Encounter: Payer: Self-pay | Admitting: Family Medicine

## 2016-01-23 VITALS — BP 118/60 | HR 66 | Temp 101.6°F | Wt 162.0 lb

## 2016-01-23 DIAGNOSIS — J020 Streptococcal pharyngitis: Secondary | ICD-10-CM

## 2016-01-23 DIAGNOSIS — R112 Nausea with vomiting, unspecified: Secondary | ICD-10-CM

## 2016-01-23 MED ORDER — ONDANSETRON 8 MG PO TBDP
8.0000 mg | ORAL_TABLET | Freq: Once | ORAL | Status: AC
  Administered 2016-01-23: 8 mg via ORAL

## 2016-01-23 MED ORDER — AMOXICILLIN 500 MG PO TABS: 500.0000 mg | ORAL_TABLET | Freq: Three times a day (TID) | ORAL | 0 refills | Status: DC

## 2016-01-23 MED ORDER — ONDANSETRON 8 MG PO TBDP: 8.0000 mg | ORAL_TABLET | Freq: Three times a day (TID) | ORAL | 0 refills | Status: DC | PRN

## 2016-01-23 NOTE — Patient Instructions (Signed)

## 2016-01-23 NOTE — Progress Notes (Signed)
   Subjective:    Patient ID: Miranda Willis, female    DOB: 1955/08/30, 60 y.o.   MRN: [id number]  HPI This is a 60 yo female who presents today with sore throat, headache and fever for 2 days. Started with left sided throat pain. Has vomited twice. Left ear pain. No cough. Grandchild has been sick. Had strep throat in the spring. Able to drink liquids. No cough or nasal drainage. Last took tylenol at 4:30 this morning. Takes etodolac once a day for joint pain. Vomited x 1, watery, yellow fluid.   Past Medical History:  Diagnosis Date  . Hypertension   . Osteoarthritis    Past Surgical History:  Procedure Laterality Date  . DILATION AND CURETTAGE OF UTERUS  1985   due to miscarriage  . NOSE SURGERY    . SPINE SURGERY  06/1996   back surgery L5-S1 Dr Jenean Lindau   Family History  Problem Relation Age of Onset  . Heart disease Mother     MI  . Depression Mother   . Heart disease Father     MI  . Depression Father   . Heart disease Maternal Grandmother     MI and CAD  . Depression Paternal Grandfather   . Colon cancer Neg Hx   . Breast cancer Neg Hx    Social History  Substance Use Topics  . Smoking status: Never Smoker  . Smokeless tobacco: Never Used  . Alcohol use No      Review of Systems Per HPI    Objective:   Physical Exam  Constitutional: She is oriented to person, place, and time. She appears well-developed and well-nourished. She appears ill.  HENT:  Head: Normocephalic and atraumatic.  Right Ear: Tympanic membrane, external ear and ear canal normal.  Left Ear: Tympanic membrane, external ear and ear canal normal.  Nose: Nose normal.  Mouth/Throat: Uvula is midline. Oropharyngeal exudate and posterior oropharyngeal erythema present. No posterior oropharyngeal edema or tonsillar abscesses.  Large amount exudate  Cardiovascular: Normal rate.   Pulmonary/Chest: Effort normal.  Neurological: She is alert and oriented to person, place, and time.  Skin: Skin is  warm and dry.  Psychiatric: She has a normal mood and affect. Her behavior is normal. Judgment and thought content normal.  Vitals reviewed.     BP 118/60   Pulse 66   Temp (!) 101.6 F (38.7 C)   Wt 162 lb (73.5 kg)   SpO2 94%   BMI 27.81 kg/m  Wt Readings from Last 3 Encounters:  01/23/16 162 lb (73.5 kg)  07/26/15 156 lb 8 oz (71 kg)  11/28/14 176 lb 4 oz (79.9 kg)       Assessment & Plan:  1. Streptococcal sore throat - Provided written and verbal information regarding diagnosis and treatment. - amoxicillin (AMOXIL) 500 MG tablet; Take 1 tablet (500 mg total) by mouth 3 (three) times daily.  Dispense: 30 tablet; Refill: 0 - encouraged her to hydrate, can take her etodolac BID for fever/pain with tylenol prn  2. Non-intractable vomiting with nausea, unspecified vomiting type - ondansetron (ZOFRAN-ODT) disintegrating tablet 8 mg; Take 1 tablet (8 mg total) by mouth once. - OOW x 2 days  Clarene Reamer, FNP-BC  [facility], [facility]  01/23/2016 9:18 AM

## 2016-03-30 ENCOUNTER — Other Ambulatory Visit: Payer: Self-pay | Admitting: Family Medicine

## 2016-04-02 ENCOUNTER — Other Ambulatory Visit: Payer: Self-pay | Admitting: Family Medicine

## 2016-04-08 ENCOUNTER — Ambulatory Visit (INDEPENDENT_AMBULATORY_CARE_PROVIDER_SITE_OTHER): Payer: BC Managed Care – PPO | Admitting: Family Medicine

## 2016-04-08 ENCOUNTER — Encounter: Payer: Self-pay | Admitting: Family Medicine

## 2016-04-08 VITALS — BP 140/66 | HR 65 | Temp 98.0°F | Wt 170.2 lb

## 2016-04-08 DIAGNOSIS — Z1211 Encounter for screening for malignant neoplasm of colon: Secondary | ICD-10-CM | POA: Diagnosis not present

## 2016-04-08 DIAGNOSIS — I10 Essential (primary) hypertension: Secondary | ICD-10-CM | POA: Diagnosis not present

## 2016-04-08 DIAGNOSIS — M1990 Unspecified osteoarthritis, unspecified site: Secondary | ICD-10-CM

## 2016-04-08 DIAGNOSIS — I159 Secondary hypertension, unspecified: Secondary | ICD-10-CM | POA: Diagnosis not present

## 2016-04-08 DIAGNOSIS — Z0000 Encounter for general adult medical examination without abnormal findings: Secondary | ICD-10-CM

## 2016-04-08 MED ORDER — FLUTICASONE PROPIONATE 50 MCG/ACT NA SUSP
2.0000 | Freq: Every day | NASAL | 12 refills | Status: DC
Start: 2016-04-08 — End: 2017-03-18

## 2016-04-08 MED ORDER — LISINOPRIL 10 MG PO TABS: 10.0000 mg | ORAL_TABLET | Freq: Every day | ORAL | 3 refills | Status: DC

## 2016-04-08 MED ORDER — ETODOLAC 400 MG PO TABS: ORAL_TABLET | ORAL | 3 refills | Status: DC

## 2016-04-08 NOTE — Progress Notes (Signed)
Pre visit review using our clinic review tool, if applicable. No additional management support is needed unless otherwise documented below in the visit note. 

## 2016-04-08 NOTE — Patient Instructions (Addendum)
Go to the lab on the way out.  We'll contact you with your lab report, after a lab visit. You can try skipping a dose of etodolac occasionally to see if you can tolerate that.   Take care.  Glad to see you.   You can call for a mammogram at either of these locations:  [facility] [place] [facility] [place] [phone]

## 2016-04-08 NOTE — Progress Notes (Signed)
Hypertension:    Using medication without problems or lightheadedness: yes Chest pain with exertion:no Edema:no Short of breath:no Due for labs.   Taking nsaid once daily w/o ADE.  No complication from med.  It helps her stiffness and knee/arm pain the most.  No GI side effects.  D/w pt about dosing options.    Mammogram encouraged.  See AVS.  D/w patient [id number] for colon cancer screening, including IFOB vs. colonoscopy.  Risks and benefits of both were discussed and patient voiced understanding.  Pt elects for: IFOB.  Flu shot declined.  Encouraged.    Meds, vitals, and allergies reviewed.   ROS: Per HPI unless specifically indicated in ROS section   GEN: nad, alert and oriented HEENT: mucous membranes moist NECK: supple w/o LA CV: rrr. PULM: ctab, no inc wob ABD: soft, +bs EXT: no edema

## 2016-04-09 ENCOUNTER — Other Ambulatory Visit (INDEPENDENT_AMBULATORY_CARE_PROVIDER_SITE_OTHER): Payer: BC Managed Care – PPO

## 2016-04-09 DIAGNOSIS — I159 Secondary hypertension, unspecified: Secondary | ICD-10-CM

## 2016-04-09 LAB — BASIC METABOLIC PANEL WITH GFR
BUN: 16 mg/dL (ref 6–23)
CO2: 28 meq/L (ref 19–32)
Calcium: 9.4 mg/dL (ref 8.4–10.5)
Chloride: 107 meq/L (ref 96–112)
Creatinine, Ser: 0.67 mg/dL (ref 0.40–1.20)
GFR: 95.67 mL/min
Glucose, Bld: 122 mg/dL — ABNORMAL HIGH (ref 70–99)
Potassium: 4.6 meq/L (ref 3.5–5.1)
Sodium: 142 meq/L (ref 135–145)

## 2016-04-09 LAB — LIPID PANEL
Cholesterol: 171 mg/dL (ref 0–200)
HDL: 48.4 mg/dL
LDL Cholesterol: 92 mg/dL (ref 0–99)
NonHDL: 122.94
Total CHOL/HDL Ratio: 4
Triglycerides: 156 mg/dL — ABNORMAL HIGH (ref 0.0–149.0)
VLDL: 31.2 mg/dL (ref 0.0–40.0)

## 2016-04-10 ENCOUNTER — Other Ambulatory Visit: Payer: Self-pay | Admitting: Family Medicine

## 2016-04-10 DIAGNOSIS — R739 Hyperglycemia, unspecified: Secondary | ICD-10-CM

## 2016-04-10 NOTE — Assessment & Plan Note (Signed)
Mammogram encouraged.  See AVS.  D/w patient [id number] for colon cancer screening, including IFOB vs. colonoscopy.  Risks and benefits of both were discussed and patient voiced understanding.  Pt elects for: IFOB.  Flu shot declined.  Encouraged.

## 2016-04-10 NOTE — Assessment & Plan Note (Signed)
Continue prn nsaid, okay to skip a dose to see if she can tolerate prn dosing.  D/w pt about Cr and ACE use.  Return for labs.  She agrees.   >25 minutes spent in face to face time with patient, >50% spent in counselling or coordination of care.

## 2016-04-10 NOTE — Assessment & Plan Note (Signed)
No change in meds . Return for fasting labs.  D/w pt.  She agrees.

## 2016-04-15 ENCOUNTER — Encounter: Payer: Self-pay | Admitting: *Deleted

## 2016-04-23 ENCOUNTER — Telehealth: Payer: Self-pay | Admitting: Family Medicine

## 2016-04-23 NOTE — Telephone Encounter (Signed)
Patient received her lab results in the mail.  Patient is asking for a call back to explain lab results.

## 2016-04-23 NOTE — Telephone Encounter (Signed)
Called patient back and reviewed labs with her. Lab appointment scheduled per Dr. Josefine Class instructions.

## 2016-04-29 ENCOUNTER — Other Ambulatory Visit (INDEPENDENT_AMBULATORY_CARE_PROVIDER_SITE_OTHER): Payer: BC Managed Care – PPO

## 2016-04-29 DIAGNOSIS — R739 Hyperglycemia, unspecified: Secondary | ICD-10-CM | POA: Diagnosis not present

## 2016-04-29 LAB — HEMOGLOBIN A1C: Hgb A1c MFr Bld: 5.9 % (ref 4.6–6.5)

## 2016-05-05 ENCOUNTER — Encounter: Payer: Self-pay | Admitting: *Deleted

## 2016-05-14 ENCOUNTER — Encounter: Payer: Self-pay | Admitting: *Deleted

## 2016-05-14 ENCOUNTER — Other Ambulatory Visit (INDEPENDENT_AMBULATORY_CARE_PROVIDER_SITE_OTHER): Payer: BC Managed Care – PPO

## 2016-05-14 DIAGNOSIS — Z1211 Encounter for screening for malignant neoplasm of colon: Secondary | ICD-10-CM

## 2016-05-14 LAB — FECAL OCCULT BLOOD, IMMUNOCHEMICAL: FECAL OCCULT BLD: NEGATIVE

## 2017-03-18 ENCOUNTER — Encounter: Payer: Self-pay | Admitting: Family Medicine

## 2017-03-18 ENCOUNTER — Ambulatory Visit: Payer: BC Managed Care – PPO | Admitting: Family Medicine

## 2017-03-18 VITALS — BP 118/70 | HR 76 | Temp 98.6°F | Wt 177.0 lb

## 2017-03-18 DIAGNOSIS — J3489 Other specified disorders of nose and nasal sinuses: Secondary | ICD-10-CM | POA: Diagnosis not present

## 2017-03-18 MED ORDER — LORATADINE 10 MG PO TABS: 10.0000 mg | ORAL_TABLET | Freq: Every day | ORAL | 3 refills | Status: DC | PRN

## 2017-03-18 MED ORDER — FLUTICASONE PROPIONATE 50 MCG/ACT NA SUSP: 2.0000 | Freq: Every day | NASAL | 3 refills | Status: DC

## 2017-03-18 MED ORDER — AMOXICILLIN-POT CLAVULANATE 875-125 MG PO TABS: 1.0000 | ORAL_TABLET | Freq: Two times a day (BID) | ORAL | 0 refills | Status: DC

## 2017-03-18 NOTE — Progress Notes (Signed)
duration of symptoms: about 4 weeks.  Tried OTC cough/cold/allergy meds.  Worse in the last week.  Rhinorrhea: would be if not currently treated with otc meds Congestion: yes ear pain: no sore throat: yes Cough: yes Myalgias: yes but likely from job related strains/joint pain No fevers but feels hot.    Per HPI unless specifically indicated in ROS section   Meds, vitals, and allergies reviewed.   GEN: nad, alert and oriented HEENT: mucous membranes moist, TM w/o erythema, nasal epithelium injected, OP with cobblestoning B frontal and L max sinus ttp  NECK: supple w/o LA CV: rrr. PULM: ctab, no inc wob ABD: soft, +bs EXT: no edema

## 2017-03-18 NOTE — Patient Instructions (Addendum)
Restart the flonase and if not better then start the antibiotics.   Update me as needed.   Take care.  Glad to see you.

## 2017-03-19 DIAGNOSIS — J3489 Other specified disorders of nose and nasal sinuses: Secondary | ICD-10-CM | POA: Insufficient documentation

## 2017-03-19 NOTE — Assessment & Plan Note (Signed)
Nontoxic, d/w pt about options.  Supportive care.  Restart the flonase and if not better then start the augmentin.   Update me as needed.   She agrees.

## 2017-05-26 ENCOUNTER — Other Ambulatory Visit: Payer: Self-pay | Admitting: Family Medicine

## 2017-05-26 NOTE — Telephone Encounter (Signed)
Electronic refill request. Lisinopril Last office visit:   03/18/17  Acute Visit   04/08/16 Follow Up HTN Last CPE:  11/28/2014    No upcoming appointments scheduled. Last Filled:    90 tablet 3 04/08/2016  Please advise.

## 2017-05-27 NOTE — Telephone Encounter (Signed)
Patient notified as instructed by telephone. Appointments scheduled as instructed.

## 2017-05-27 NOTE — Telephone Encounter (Signed)
Needs CPE vs HTN f/u scheduled with labs ahead of time.  rx sent.  Thanks.

## 2017-06-16 ENCOUNTER — Other Ambulatory Visit: Payer: Self-pay | Admitting: Family Medicine

## 2017-06-16 NOTE — Telephone Encounter (Signed)
Electronic refill request. Etodolac Last office visit:   03/18/17 Last Filled:    180 tablet 3 04/08/2016  Please advise.

## 2017-06-17 NOTE — Telephone Encounter (Signed)
Sent. Thanks.  Has f/u pending.  

## 2017-07-08 ENCOUNTER — Ambulatory Visit: Payer: BC Managed Care – PPO | Admitting: Family Medicine

## 2017-07-08 ENCOUNTER — Encounter: Payer: Self-pay | Admitting: Family Medicine

## 2017-07-08 ENCOUNTER — Other Ambulatory Visit: Payer: Self-pay

## 2017-07-08 VITALS — BP 140/60 | HR 87 | Temp 99.3°F | Ht 63.0 in | Wt 160.2 lb

## 2017-07-08 DIAGNOSIS — R509 Fever, unspecified: Secondary | ICD-10-CM | POA: Diagnosis not present

## 2017-07-08 DIAGNOSIS — J029 Acute pharyngitis, unspecified: Secondary | ICD-10-CM

## 2017-07-08 DIAGNOSIS — J101 Influenza due to other identified influenza virus with other respiratory manifestations: Secondary | ICD-10-CM

## 2017-07-08 LAB — POC INFLUENZA A&B (BINAX/QUICKVUE)
INFLUENZA A, POC: POSITIVE — AB
Influenza B, POC: NEGATIVE

## 2017-07-08 LAB — POCT RAPID STREP A (OFFICE): RAPID STREP A SCREEN: NEGATIVE

## 2017-07-08 MED ORDER — OSELTAMIVIR PHOSPHATE 75 MG PO CAPS: 75.0000 mg | ORAL_CAPSULE | Freq: Two times a day (BID) | ORAL | 0 refills | Status: DC

## 2017-07-08 NOTE — Progress Notes (Signed)
Dr. Frederico Hamman T. Oleva Koo, MD, [facility] [place], [address] Phone: [phone] Fax: [phone]  07/08/2017  Patient: Miranda Willis, MRN: [id number], DOB: 11/15/1955, 62 y.o.  Primary Physician:  Tonia Ghent, MD   Chief Complaint  Patient presents with  . Sore Throat    started last night  . Fever   Subjective:   Miranda Willis presents with runny nose, sneezing, cough, sore throat, malaise, myalgias, arthralgia, chills, and fever.  + recent exposure to others with similar symptoms. Grandchildren with flu  The patent denies sore throat as the primary complaint. Denies sthortness of breath/wheezing, otalgia, facial pain, abdominal pain, changes in bowel or bladder.  Generally feels terrible  Tmax: ?  PMH, PHS, Allergies, Problem List, Medications, Family History, and Social History have all been reviewed.  Patient Active Problem List   Diagnosis Date Noted  . Sinus pain 03/19/2017  . Healthcare maintenance 11/29/2014  . Advance care planning 11/29/2014  . Hypertriglyceridemia 10/09/2013  . Hyperglycemia 10/09/2013  . Rash 11/29/2010  . Shoulder pain, left 10/15/2010  . Osteoarthritis 06/05/2010  . ALLERGIC RHINITIS 01/02/2009  . DEGENERATIVE JOINT DISEASE, 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  . ROTATOR CUFF SYNDROME, RIGHT 01/03/2008  . Essential hypertension 12/15/2007    Past Medical History:  Diagnosis Date  . Hypertension   . Osteoarthritis     Past Surgical History:  Procedure Laterality Date  . DILATION AND CURETTAGE OF UTERUS  1985   due to miscarriage  . NOSE SURGERY    . SPINE SURGERY  06/1996   back surgery L5-S1 Dr Jenean Lindau    Social History   Socioeconomic History  . Marital status: Married    Spouse name: Not on file  . Number of children: Not on file  . Years of education: Not on file  . Highest education level: Not on file  Occupational History  . Not on file  Social Needs    . Financial resource strain: Not on file  . Food insecurity:    Worry: Not on file    Inability: Not on file  . Transportation needs:    Medical: Not on file    Non-medical: Not on file  Tobacco Use  . Smoking status: Never Smoker  . Smokeless tobacco: Never Used  Substance and Sexual Activity  . Alcohol use: No    Alcohol/week: 0.0 oz  . Drug use: No  . Sexual activity: Not on file  Lifestyle  . Physical activity:    Days per week: Not on file    Minutes per session: Not on file  . Stress: Not on file  Relationships  . Social connections:    Talks on phone: Not on file    Gets together: Not on file    Attends religious service: Not on file    Active member of club or organization: Not on file    Attends meetings of clubs or organizations: Not on file    Relationship status: Not on file  . Intimate partner violence:    Fear of current or ex partner: Not on file    Emotionally abused: Not on file    Physically abused: Not on file    Forced sexual activity: Not on file  Other Topics Concern  . 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 Heart disease Mother        MI  . Depression Mother   . Heart disease Father        MI  . Depression Father   . Heart disease Maternal Grandmother        MI and CAD  . Depression Paternal Grandfather   . Colon cancer Neg Hx   . Breast cancer Neg Hx     No Known Allergies  Medication list reviewed and updated in full in [facility].  ROS as above, eating and drinking - tolerating PO. Urinating normally. No excessive vomitting or diarrhea.   Objective:   Blood pressure 140/60, pulse 87, temperature 99.3 F (37.4 C), temperature source Oral, height 5\' 3"  (1.6 m), weight 160 lb 4 oz (72.7 kg).  Gen: WDWN, NAD; A & O x3, cooperative. Pleasant.Globally Non-toxic HEENT: Normocephalic and atraumatic.  Throat clear, w/o exudate, R TM clear, L TM - good landmarks, No fluid present. rhinnorhea. No frontal or maxillary sinus T. MMM NECK: Anterior cervical  LAD is absent CV: RRR, No M/G/R, cap refill <2 sec PULM: Breathing comfortably in no respiratory distress. no wheezing, crackles, rhonchi ABD: S,NT,ND,+BS. No HSM. No rebound. EXT: No c/c/e PSYCH: Friendly, good eye contact MSK: Nml gait  Results for orders placed or performed in visit on 07/08/17  POCT rapid strep A  Result Value Ref Range   Rapid Strep A Screen Negative Negative  POC Influenza A&B (Binax test)  Result Value Ref Range   Influenza A, POC Positive (A) Negative   Influenza B, POC Negative Negative    Flu test positive for flu A. Electronically Signed  By: Owens Loffler, MD On: 07/08/2017 1:46 PM   Assessment and Plan:   Influenza A  Sore throat - Plan: POCT rapid strep A  Fever, unspecified fever cause - Plan: POC Influenza A&B (Binax test)  The patient's clinical exam and history is consistent with a diagnosis of influenza. Flu test +  Supportive care, fluids, cough medicines as needed, and anti-pyretics. Infection control emphasized, including OOW or school until AF 24 hours.  Meds ordered this encounter  Medications  . oseltamivir (TAMIFLU) 75 MG capsule    Sig: Take 1 capsule (75 mg total) by mouth 2 (two) times daily.    Dispense:  10 capsule    Refill:  0   Orders Placed This Encounter  Procedures  . POCT rapid strep A  . POC Influenza A&B (Binax test)    Signed,  Anira Senegal T. Merilee Wible, MD   Patient's Medications  New Prescriptions   OSELTAMIVIR (TAMIFLU) 75 MG CAPSULE    Take 1 capsule (75 mg total) by mouth 2 (two) times daily.  Previous Medications   ETODOLAC (LODINE) 400 MG TABLET    TAKE 1 TABLET (400 MG TOTAL) BY MOUTH 2 (TWO) TIMES DAILY AS NEEDED (FOR PAIN. TAKE WITH FOOD.).   FLUTICASONE (FLONASE) 50 MCG/ACT NASAL SPRAY    Place 2 sprays into both nostrils daily.   LISINOPRIL  (PRINIVIL,ZESTRIL) 10 MG TABLET    TAKE 1 TABLET (10 MG TOTAL) BY MOUTH DAILY.   LORATADINE (CLARITIN) 10 MG TABLET    Take 1 tablet (10 mg total) by mouth daily as needed.  Modified Medications   No medications on file  Discontinued Medications   AMOXICILLIN-CLAVULANATE (AUGMENTIN) 875-125 MG TABLET    Take 1 tablet by mouth 2 (two) times daily.

## 2017-07-09 ENCOUNTER — Encounter: Payer: Self-pay | Admitting: Family Medicine

## 2017-07-09 ENCOUNTER — Ambulatory Visit: Payer: BC Managed Care – PPO | Admitting: Family Medicine

## 2017-07-09 DIAGNOSIS — J029 Acute pharyngitis, unspecified: Secondary | ICD-10-CM | POA: Diagnosis not present

## 2017-07-09 MED ORDER — AMOXICILLIN 875 MG PO TABS: 875.0000 mg | ORAL_TABLET | Freq: Two times a day (BID) | ORAL | 0 refills | Status: DC

## 2017-07-09 NOTE — Patient Instructions (Addendum)
Presumed strep and flu concurrently.   Start amoxil.  Rest and fluids.   Update me as needed.  Take care.  Glad to see you.

## 2017-07-09 NOTE — Progress Notes (Signed)
Sx started about 2 days ago.  Seen yesterday and flu test was positive.  Started on tamiflu in the meantime.  Encouraged flu shot, d/w pt.   RST neg yesterday.  She was worried about strep throat.  ST continues.  She noted changes on her tonsils.  Not SOB.  No vomiting, no diarrhea.  No cough.  + fever, controlled with tylenol.    Meds, vitals, and allergies reviewed.   ROS: Per HPI unless specifically indicated in ROS section   GEN: nad, alert and oriented HEENT: mucous membranes moist, tm w/o erythema, nasal exam w/o erythema, clear discharge noted,  OP with cobblestoning and B exudates.   NECK: supple w/tender LA CV: rrr.   PULM: ctab, no inc wob EXT: no edema

## 2017-07-10 NOTE — Assessment & Plan Note (Signed)
5/5 strep throat criteria (fever, sore throat, exudates on tonsils, tender lymphadenopathy, absence of cough), presumed false neg RST recently.  She already had a positive flu test.  I presume that she has both flu and strep throat.  Continue treatment with Tamiflu.  Start amoxicillin.  Supportive care otherwise.  Flu shot encouraged.  Update me as needed.  Okay for outpatient follow-up.

## 2017-07-16 ENCOUNTER — Other Ambulatory Visit: Payer: Self-pay | Admitting: Family Medicine

## 2017-07-16 DIAGNOSIS — I10 Essential (primary) hypertension: Secondary | ICD-10-CM

## 2017-07-16 DIAGNOSIS — R739 Hyperglycemia, unspecified: Secondary | ICD-10-CM

## 2017-07-17 ENCOUNTER — Other Ambulatory Visit (INDEPENDENT_AMBULATORY_CARE_PROVIDER_SITE_OTHER): Payer: BC Managed Care – PPO

## 2017-07-17 DIAGNOSIS — I10 Essential (primary) hypertension: Secondary | ICD-10-CM

## 2017-07-17 DIAGNOSIS — R739 Hyperglycemia, unspecified: Secondary | ICD-10-CM | POA: Diagnosis not present

## 2017-07-17 LAB — LIPID PANEL
Cholesterol: 160 mg/dL (ref 0–200)
HDL: 35.9 mg/dL — AB (ref >39.00)
LDL CALC: 88 mg/dL (ref 0–99)
NONHDL: 124.29
Total CHOL/HDL Ratio: 4
Triglycerides: 180 mg/dL — ABNORMAL HIGH (ref 0.0–149.0)
VLDL: 36 mg/dL (ref 0.0–40.0)

## 2017-07-17 LAB — COMPREHENSIVE METABOLIC PANEL
ALBUMIN: 4.3 g/dL (ref 3.5–5.2)
ALK PHOS: 54 U/L (ref 39–117)
ALT: 21 U/L (ref 0–35)
AST: 17 U/L (ref 0–37)
BUN: 13 mg/dL (ref 6–23)
CHLORIDE: 106 meq/L (ref 96–112)
CO2: 30 mEq/L (ref 19–32)
Calcium: 9.9 mg/dL (ref 8.4–10.5)
Creatinine, Ser: 0.66 mg/dL (ref 0.40–1.20)
GFR: 96.93 mL/min (ref >60.00)
GLUCOSE: 99 mg/dL (ref 70–99)
POTASSIUM: 4.7 meq/L (ref 3.5–5.1)
SODIUM: 142 meq/L (ref 135–145)
TOTAL PROTEIN: 7.2 g/dL (ref 6.0–8.3)
Total Bilirubin: 0.4 mg/dL (ref 0.2–1.2)

## 2017-07-17 LAB — HEMOGLOBIN A1C: HEMOGLOBIN A1C: 6 % (ref 4.6–6.5)

## 2017-07-21 ENCOUNTER — Encounter: Payer: Self-pay | Admitting: Family Medicine

## 2017-07-21 ENCOUNTER — Ambulatory Visit (INDEPENDENT_AMBULATORY_CARE_PROVIDER_SITE_OTHER): Payer: BC Managed Care – PPO | Admitting: Family Medicine

## 2017-07-21 ENCOUNTER — Encounter: Payer: BC Managed Care – PPO | Admitting: Family Medicine

## 2017-07-21 VITALS — BP 138/68 | HR 66 | Temp 98.1°F | Ht 63.0 in | Wt 160.8 lb

## 2017-07-21 DIAGNOSIS — Z0000 Encounter for general adult medical examination without abnormal findings: Secondary | ICD-10-CM | POA: Diagnosis not present

## 2017-07-21 DIAGNOSIS — Z7189 Other specified counseling: Secondary | ICD-10-CM

## 2017-07-21 DIAGNOSIS — Z1211 Encounter for screening for malignant neoplasm of colon: Secondary | ICD-10-CM

## 2017-07-21 DIAGNOSIS — R739 Hyperglycemia, unspecified: Secondary | ICD-10-CM

## 2017-07-21 DIAGNOSIS — M1990 Unspecified osteoarthritis, unspecified site: Secondary | ICD-10-CM

## 2017-07-21 DIAGNOSIS — I10 Essential (primary) hypertension: Secondary | ICD-10-CM

## 2017-07-21 MED ORDER — LISINOPRIL 10 MG PO TABS: 10.0000 mg | ORAL_TABLET | Freq: Every day | ORAL | 3 refills | Status: DC

## 2017-07-21 NOTE — Patient Instructions (Addendum)
Check with your insurance to see if they will cover the shingrix shot. I would get a flu shot each fall.    You can call for a mammogram at [facility]  Go to the lab on the way out.  We'll contact you with your lab report. Take care.  Glad to see you.  Update me as needed.

## 2017-07-21 NOTE — Progress Notes (Signed)
CPE- See plan.  Routine anticipatory guidance given to patient.  See health maintenance.  The possibility exists that previously documented standard health maintenance information may have been brought forward from a previous encounter into this note.  If needed, that same information has been updated to reflect the current situation based on today's encounter.    Tetanus 2014 Flu encouraged.   PNA due 66 Shingles d/w pt.  Shingrix is back ordered.   Mammogram encouraged.   DXA due at 66 Pap 2015, no h/o abnormals.  dw pt about q5year f/u.  That was rec'd per gyn per patient report.   D/w patient [id number] for colon cancer screening, including IFOB vs. colonoscopy.  Risks and benefits of both were discussed and patient voiced understanding.  Pt elects HWY:SHUO.  Living will d/w pt.  Husband designated if patient were incapacitated.    Diet and exercise.  She is working on both.  She is doing yoga.  She cut out soda.     Hypertension:    Using medication without problems or lightheadedness: yes Chest pain with exertion:no Edema:no Short of breath:no Labs d/w pt.    Etodolac helps with joint pain.  No ADE on med. Cr wnl.    PMH and SH reviewed  Meds, vitals, and allergies reviewed.   ROS: Per HPI.  Unless specifically indicated otherwise in HPI, the patient denies:  General: fever. Eyes: acute vision changes ENT: sore throat Cardiovascular: chest pain Respiratory: SOB GI: vomiting GU: dysuria Musculoskeletal: acute back pain Derm: acute rash Neuro: acute motor dysfunction Psych: worsening mood Endocrine: polydipsia Heme: bleeding Allergy: hayfever  GEN: nad, alert and oriented HEENT: mucous membranes moist NECK: supple w/o LA CV: rrr. PULM: ctab, no inc wob ABD: soft, +bs EXT: no edema SKIN: no acute rash

## 2017-07-22 NOTE — Assessment & Plan Note (Signed)
Controlled.  Labs discussed with patient.  Continue as is.  Continue work on diet and exercise. She agrees.

## 2017-07-22 NOTE — Assessment & Plan Note (Signed)
Tetanus 2014 Flu encouraged.   PNA due 66 Shingles d/w pt.  Shingrix is back ordered.   Mammogram encouraged.   DXA due at 66 Pap 2015, no h/o abnormals.  dw pt about q5year f/u.  That was rec'd per gyn per patient report.   D/w patient [id number] for colon cancer screening, including IFOB vs. colonoscopy.  Risks and benefits of both were discussed and patient voiced understanding.  Pt elects TCY:ELYH.  Living will d/w pt.  Husband designated if patient were incapacitated.    Diet and exercise.  She is working on both.  She is doing yoga.  She cut out soda.

## 2017-07-22 NOTE — Assessment & Plan Note (Signed)
Living will d/w pt.  Husband designated if patient were incapacitated.  

## 2017-07-22 NOTE — Assessment & Plan Note (Signed)
Some relief from medication.  No adverse effect.  Continue as is.  She agrees.

## 2017-07-22 NOTE — Assessment & Plan Note (Signed)
History of.  Improved with diet and exercise.  Continue as is.  I thanked her for her effort.

## 2017-08-06 ENCOUNTER — Other Ambulatory Visit (INDEPENDENT_AMBULATORY_CARE_PROVIDER_SITE_OTHER): Payer: BC Managed Care – PPO

## 2017-08-06 DIAGNOSIS — Z1211 Encounter for screening for malignant neoplasm of colon: Secondary | ICD-10-CM

## 2017-08-06 LAB — FECAL OCCULT BLOOD, IMMUNOCHEMICAL: FECAL OCCULT BLD: NEGATIVE

## 2017-09-14 ENCOUNTER — Encounter: Payer: Self-pay | Admitting: Family Medicine

## 2017-09-14 ENCOUNTER — Ambulatory Visit: Payer: BC Managed Care – PPO | Admitting: Family Medicine

## 2017-09-14 VITALS — BP 120/60 | HR 105 | Temp 102.9°F | Ht 63.0 in | Wt 153.8 lb

## 2017-09-14 DIAGNOSIS — R509 Fever, unspecified: Secondary | ICD-10-CM

## 2017-09-14 DIAGNOSIS — J029 Acute pharyngitis, unspecified: Secondary | ICD-10-CM

## 2017-09-14 MED ORDER — ONDANSETRON 4 MG PO TBDP
4.0000 mg | ORAL_TABLET | Freq: Once | ORAL | Status: AC
  Administered 2017-09-14: 4 mg via ORAL

## 2017-09-14 MED ORDER — AMOXICILLIN 875 MG PO TABS: 875.0000 mg | ORAL_TABLET | Freq: Two times a day (BID) | ORAL | 0 refills | Status: DC

## 2017-09-14 MED ORDER — ONDANSETRON HCL 4 MG PO TABS: 4.0000 mg | ORAL_TABLET | Freq: Three times a day (TID) | ORAL | 0 refills | Status: DC | PRN

## 2017-09-14 NOTE — Progress Notes (Signed)
She was fine the day prior to yesterday.  Was fine until last night, went to bed early with ST.  Took some tylenol last night, she didn't feel well in general.  Started vomiting this AM, about 4 AM.  Now dry heaving.  No diarrhea.  No abd pain.  Still with ST, HA.  No ear pain.  Some mild nasal congestion.  Minimal cough.  Out of work today.  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    This is similar to prev   Meds, vitals, and allergies reviewed.   ROS: Per HPI unless specifically indicated in ROS section   GEN: nad, alert and oriented HEENT: mucous membranes moist, tm w/o erythema, nasal exam w/o erythema, clear discharge noted,  OP with cobblestoning and exudates on tonsils B NECK: supple w/o LA CV: rrr.   PULM: ctab, no inc wob EXT: no edema SKIN: well perfused.

## 2017-09-14 NOTE — Patient Instructions (Signed)
Presumed strep throat.  Gatorade, pop sickles, pedialyte, and water are all helpful.  Stop lisinopril for now.  Out of work for now.  zofran for nausea.  Start amoxil.  Update [place] as needed.

## 2017-09-15 NOTE — Assessment & Plan Note (Signed)
Presumed strep throat.  She has fever, sore throat, absence of cough, tonsillar exudates.  This feels like she did when she previously had strep.  She has nausea and vomiting that can be seen strep throat.  The only criteria she does not meet his lymphadenopathy but this may be because of the relatively recent onset symptoms.  No need to test at this point.  Start amoxicillin.  Use Zofran as needed.  Rest and fluids.  Update me as needed.  She agrees.  Okay for outpatient follow-up.

## 2018-03-25 ENCOUNTER — Ambulatory Visit: Payer: BC Managed Care – PPO | Admitting: Family Medicine

## 2018-03-25 ENCOUNTER — Encounter: Payer: Self-pay | Admitting: Family Medicine

## 2018-03-25 VITALS — BP 144/72 | HR 76 | Temp 98.6°F | Resp 10 | Ht 63.0 in | Wt 155.0 lb

## 2018-03-25 DIAGNOSIS — J0140 Acute pansinusitis, unspecified: Secondary | ICD-10-CM | POA: Diagnosis not present

## 2018-03-25 MED ORDER — AMOXICILLIN-POT CLAVULANATE 875-125 MG PO TABS: 1.0000 | ORAL_TABLET | Freq: Two times a day (BID) | ORAL | 0 refills | Status: AC

## 2018-03-25 NOTE — Progress Notes (Signed)
Subjective:     Miranda Willis is a 62 y.o. female presenting for Nasal Congestion (x 3 weeks. Headaches, congestion, feels like she has ran low grade fever, some chills, fatigue. Did have sore throat 2 weeks ago and resolved now. Has tried Robitussin, Flonase, allergy relief medication.)     Sinusitis  This is a new problem. The current episode started 1 to 4 weeks ago. The problem has been gradually worsening since onset. The maximum temperature recorded prior to her arrival was 100.4 - 100.9 F. The fever has been present for less than 1 day. Associated symptoms include chills, congestion, coughing (resolved now), headaches, neck pain, sinus pressure and a sore throat. Pertinent negatives include no shortness of breath. (Some dental pain) Past treatments include oral decongestants and spray decongestants. The treatment provided mild relief.   Has not tried saline rinse  Review of Systems  Constitutional: Positive for chills.  HENT: Positive for congestion, sinus pressure and sore throat.   Respiratory: Positive for cough (resolved now). Negative for shortness of breath.   Musculoskeletal: Positive for neck pain.  Neurological: Positive for headaches.     Social History   Tobacco Use  Smoking Status Never Smoker  Smokeless Tobacco Never Used        Objective:    BP Readings from Last 3 Encounters:  03/25/18 (!) 144/72  09/14/17 120/60  07/21/17 138/68   Wt Readings from Last 3 Encounters:  03/25/18 155 lb (70.3 kg)  09/14/17 153 lb 12 oz (69.7 kg)  07/21/17 160 lb 12 oz (72.9 kg)    BP (!) 144/72   Pulse 76   Temp 98.6 F (37 C)   Resp 10   Ht 5\' 3"  (1.6 m)   Wt 155 lb (70.3 kg)   SpO2 99%   BMI 27.46 kg/m    Physical Exam Constitutional:      General: She is not in acute distress.    Appearance: She is well-developed. She is not diaphoretic.  HENT:     Head: Normocephalic and atraumatic.     Right Ear: Tympanic membrane and ear canal normal.   Left Ear: Tympanic membrane and ear canal normal.     Nose: Mucosal edema and rhinorrhea present.     Right Sinus: Maxillary sinus tenderness and frontal sinus tenderness present.     Left Sinus: Maxillary sinus tenderness and frontal sinus tenderness present.     Mouth/Throat:     Pharynx: Uvula midline. Posterior oropharyngeal erythema present. No oropharyngeal exudate.     Tonsils: Swelling: 0 on the right. 0 on the left.  Eyes:     General: No scleral icterus.    Conjunctiva/sclera: Conjunctivae normal.  Neck:     Musculoskeletal: Neck supple.  Cardiovascular:     Rate and Rhythm: Normal rate and regular rhythm.     Heart sounds: Normal heart sounds. No murmur.  Pulmonary:     Effort: Pulmonary effort is normal. No respiratory distress.     Breath sounds: Normal breath sounds.  Lymphadenopathy:     Cervical: No cervical adenopathy.  Skin:    General: Skin is warm and dry.     Capillary Refill: Capillary refill takes less than 2 seconds.  Neurological:     Mental Status: She is alert. Mental status is at baseline.  Psychiatric:        Mood and Affect: Mood normal.           Assessment & Plan:   Problem List  Items Addressed This Visit    None    Visit Diagnoses    Acute non-recurrent pansinusitis    -  Primary   Relevant Medications   amoxicillin-clavulanate (AUGMENTIN) 875-125 MG tablet     Patient Instructions   Sinus Congestion 1) Neti Pot (Saline rinse) -- 2 times day -- if tolerated 2) Flonase (Store Brand ok) - once daily - ok to try 2 sprays in each nostril 3) Over the counter congestion medications 4) Antibiotics  Ok to stop antibiotic at 5 days if better  If you develop fevers (Temperature >100.4), chills, worsening symptoms or symptoms lasting longer than 10 days return to clinic.       Return if symptoms worsen or fail to improve.  Lesleigh Noe, MD

## 2018-03-25 NOTE — Patient Instructions (Addendum)
  Sinus Congestion 1) Neti Pot (Saline rinse) -- 2 times day -- if tolerated 2) Flonase (Store Brand ok) - once daily - ok to try 2 sprays in each nostril 3) Over the counter congestion medications 4) Antibiotics  Ok to stop antibiotic at 5 days if better  If you develop fevers (Temperature >100.4), chills, worsening symptoms or symptoms lasting longer than 10 days return to clinic.

## 2018-05-18 ENCOUNTER — Encounter: Payer: Self-pay | Admitting: Family Medicine

## 2018-05-18 ENCOUNTER — Ambulatory Visit: Payer: BC Managed Care – PPO | Admitting: Family Medicine

## 2018-05-18 VITALS — BP 136/62 | HR 80 | Temp 98.7°F | Ht 63.0 in

## 2018-05-18 DIAGNOSIS — J020 Streptococcal pharyngitis: Secondary | ICD-10-CM | POA: Diagnosis not present

## 2018-05-18 MED ORDER — AMOXICILLIN 875 MG PO TABS: 875.0000 mg | ORAL_TABLET | Freq: Two times a day (BID) | ORAL | 0 refills | Status: DC

## 2018-05-18 MED ORDER — HYDROCODONE-HOMATROPINE 5-1.5 MG/5ML PO SYRP: 5.0000 mL | ORAL_SOLUTION | Freq: Three times a day (TID) | ORAL | 0 refills | Status: DC | PRN

## 2018-05-18 NOTE — Progress Notes (Signed)
She had sinus pressure about 1 week ago, had been taking claritin and robitussin.   Family members with vomiting and diarrhea recently.  She had diarrhea once, vomited once.  She has used old zofran rx for nausea with some relief.  Taking tylenol in the meantime.    This AM with HA, ST.  Likely had a fever this AM.   Known case of strep- granddaughter tested positive.    Meds, vitals, and allergies reviewed.   ROS: Per HPI unless specifically indicated in ROS section   GEN: nad, alert and oriented HEENT: mucous membranes moist, tm w/o erythema, nasal exam w/o erythema, clear discharge noted,  OP with cobblestoning and B exudates.   NECK: supple w/tender LA CV: rrr.   PULM: ctab, no inc wob EXT: no edema SKIN: well perfused.

## 2018-05-18 NOTE — Patient Instructions (Signed)
Presumed strep.  Start amoxil.  Rest and fluids.  Use hycodan for cough, likely a post infectious cough.   Update [place] as needed.  Take care.  Glad to see you.

## 2018-05-19 DIAGNOSIS — J020 Streptococcal pharyngitis: Secondary | ICD-10-CM | POA: Insufficient documentation

## 2018-05-19 NOTE — Assessment & Plan Note (Addendum)
Presumed strep.  Start amoxil.  Rest and fluids.  Use hycodan for cough, likely a post infectious cough.   Update [place] as needed.  She agrees.  Still okay for outpatient follow-up.

## 2018-06-03 LAB — HM PAP SMEAR

## 2018-06-08 ENCOUNTER — Encounter: Payer: Self-pay | Admitting: Family Medicine

## 2018-06-18 LAB — COLOGUARD: Cologuard: NEGATIVE

## 2018-06-22 ENCOUNTER — Other Ambulatory Visit: Payer: Self-pay | Admitting: Obstetrics and Gynecology

## 2018-06-22 DIAGNOSIS — R928 Other abnormal and inconclusive findings on diagnostic imaging of breast: Secondary | ICD-10-CM

## 2018-06-30 ENCOUNTER — Ambulatory Visit
Admission: RE | Admit: 2018-06-30 | Discharge: 2018-06-30 | Disposition: A | Discharge status: Home / Self Care | Payer: BC Managed Care – PPO | Source: Ambulatory Visit | Attending: Obstetrics and Gynecology | Admitting: Obstetrics and Gynecology

## 2018-06-30 ENCOUNTER — Other Ambulatory Visit: Payer: BC Managed Care – PPO

## 2018-06-30 ENCOUNTER — Other Ambulatory Visit: Payer: Self-pay

## 2018-06-30 DIAGNOSIS — R928 Other abnormal and inconclusive findings on diagnostic imaging of breast: Secondary | ICD-10-CM

## 2018-07-07 ENCOUNTER — Other Ambulatory Visit: Payer: Self-pay | Admitting: Family Medicine

## 2018-08-12 ENCOUNTER — Other Ambulatory Visit: Payer: Self-pay | Admitting: Family Medicine

## 2018-09-21 ENCOUNTER — Telehealth: Payer: Self-pay

## 2018-09-21 NOTE — Telephone Encounter (Signed)
[place] Night - Client Nonclinical Telephone Record AccessNurse Client [place] Night - Client Client Site [facility] Physician Renford Dills - MD Contact Type Call Who Is Calling Patient / Member / Family / Caregiver Caller Name [place] Phone Number [phone] Patient Name Miranda Willis Patient DOB 02/27/1956 Call Type Message Only Information Provided Reason for Call Request for General Office Information Initial Comment Caller states she needs to make a appointment. Need to speak with Dr Damita Dunnings about upcoming surgery and work related issue. Additional Comment Call Closed By: Chilton Greathouse Transaction Date/Time: 09/21/2018 7:37:35 AM (ET)

## 2018-09-21 NOTE — Telephone Encounter (Signed)
Pt already scheduled for 09/23/18 phone visit

## 2018-09-23 ENCOUNTER — Ambulatory Visit (INDEPENDENT_AMBULATORY_CARE_PROVIDER_SITE_OTHER): Payer: BC Managed Care – PPO | Admitting: Family Medicine

## 2018-09-23 DIAGNOSIS — Z659 Problem related to unspecified psychosocial circumstances: Secondary | ICD-10-CM | POA: Diagnosis not present

## 2018-09-23 NOTE — Progress Notes (Signed)
Interactive audio and video telecommunications were attempted between this provider and patient, however failed, due to patient having technical difficulties OR patient did not have access to video capability.  We continued and completed visit with audio only.   Virtual Visit via Telephone Note  I connected with patient on 09/23/18  at 11:18 AM  by telephone and verified that I am speaking with the correct person using two identifiers.  Location of patient: home  Location of MD: [place] Name of referring provider (if blank then none associated): Names per persons and role in encounter:  MD: Earlyne Iba, Patient: name listed above.    I discussed the limitations, risks, security and privacy concerns of performing an evaluation and management service by telephone and the availability of in person appointments. I also discussed with the patient that there may be a patient responsible charge related to this service. The patient expressed understanding and agreed to proceed.  CC f/u.   History of Present Illness:   She had seen gyn clinic.  She is considering total hysterectomy and bladder tack.  She is considering August if possible.  She was expected 6 weeks recovery with 2 weeks of isolation prior to surgery.  She would be out of work for 8 weeks total, likely at a minimum.    She has been working in the meantime.  She does heavy lifting (30-40 lbs) at work.  She has a higher work load at her job site situation is increasingly stressful and she reports being increasingly anxious about that.  She is considering trying to get relocated to a smaller school in the meantime.    H/o HTN.  No CP, SOB, BLE edema.  No h/o MI, no h/o CAD.   No h/o CVA, stent, etc.  It appears that she would be reasonably low risk for surgery.    She was asking about options with comfort restrictions, time out of work, Social research officer, government.  She is trying to make plans for the fall.  Observations/Objective: No apparent  distress Speech within normal limits.  Assessment and Plan: Other social stressor.  Increased job related anxiety.  Potential hysterectomy and bladder tack pending.  We discussed options.  It is reasonable to consider restrictions related to lifting before/after surgery vs work restrictions due to anxiety related stress at work.  She'll update me in about 1 month about her situation and we will go from there.  She may need to be written out given the anxiety related to her job.   She is out for the summer right now, she may not be able to have her surgery this fall depending on COVID restrictions, and we may need to make other adjustments related to job-related stressors.  She will update me and then we will go from there.  At this point she still okay for outpatient follow-up.  Follow Up Instructions: See above.   I discussed the assessment and treatment plan with the patient. The patient was provided an opportunity to ask questions and all were answered. The patient agreed with the plan and demonstrated an understanding of the instructions.   The patient was advised to call back or seek an in-person evaluation if the symptoms worsen or if the condition fails to improve as anticipated.  I provided 21 minutes of non-face-to-face time during this encounter.  Miranda Stain, MD

## 2018-09-26 DIAGNOSIS — Z659 Problem related to unspecified psychosocial circumstances: Secondary | ICD-10-CM | POA: Insufficient documentation

## 2018-09-26 NOTE — Assessment & Plan Note (Signed)
We discussed options.  It is reasonable to consider restrictions related to lifting before/after surgery vs work restrictions due to anxiety related stress at work.  She'll update me in about 1 month about her situation and we will go from there.  She may need to be written out given the anxiety related to her job.   She is out for the summer right now, she may not be able to have her surgery this fall depending on COVID restrictions, and we may need to make other adjustments related to job-related stressors.  She will update me and then we will go from there.  At this point she still okay for outpatient follow-up.

## 2018-09-30 NOTE — Telephone Encounter (Signed)
pt had 09/23/18 phone visit.

## 2018-10-12 ENCOUNTER — Other Ambulatory Visit: Payer: Self-pay | Admitting: Family Medicine

## 2018-11-11 ENCOUNTER — Encounter: Payer: Self-pay | Admitting: Family Medicine

## 2018-11-11 ENCOUNTER — Other Ambulatory Visit: Payer: Self-pay

## 2018-11-11 ENCOUNTER — Ambulatory Visit: Payer: BC Managed Care – PPO | Admitting: Family Medicine

## 2018-11-11 DIAGNOSIS — R21 Rash and other nonspecific skin eruption: Secondary | ICD-10-CM | POA: Diagnosis not present

## 2018-11-11 MED ORDER — TRIAMCINOLONE ACETONIDE 0.1 % EX CREA: 1.0000 "application " | TOPICAL_CREAM | Freq: Two times a day (BID) | CUTANEOUS | 0 refills | Status: DC

## 2018-11-11 MED ORDER — PERMETHRIN 5 % EX CREA: 1.0000 "application " | TOPICAL_CREAM | Freq: Once | CUTANEOUS | 0 refills | Status: AC

## 2018-11-11 NOTE — Patient Instructions (Addendum)
Put the cream on for the scalp down to the feet, leave on overnight and then shower and wash the bedding.   I would use TAC on the rash on your chest.   Update me as needed.  Take care.  Glad to see you.

## 2018-11-11 NOTE — Progress Notes (Signed)
Rash, recently started, itchy.  She had a possible tick bite, she had a recent episode when she was in the shower and black spot on her skin washed off, unclear if was a tick.    Had vaginal irritation (internal), used monistat last night.  Now with external itching from the waist to her groin.    She has itching in her gluteal crease.  No FCNAVD.    She has an itching rash for the last few weeks on her sternal area.  She used hydrocortisone cream w/o relief.    We talked about pandemic considerations.    Meds, vitals, and allergies reviewed.   ROS: Per HPI unless specifically indicated in ROS section   GEN: nad, alert and oriented HEENT: ncat NECK: supple w/o LA CV: rrr.  PULM: ctab, no inc wob ABD: soft, +bs EXT: no edema SKIN: faint rash on the sternal area.   Scattered blanching lesions on the vulva B and B linear gluteal crease rash noted w/o ulceration.  Chaperoned exam.

## 2018-11-15 ENCOUNTER — Other Ambulatory Visit: Payer: Self-pay | Admitting: Family Medicine

## 2018-11-15 NOTE — Telephone Encounter (Signed)
Last filled on 08/12/2018 for #180 with 0 refill. LOV was on 11/11/2018 for acute visit

## 2018-11-15 NOTE — Telephone Encounter (Signed)
Please set up. Thank you

## 2018-11-15 NOTE — Assessment & Plan Note (Signed)
Presumed to be 2 separate issues, this is the most likely explanation.  She looks like she has some benign irritation on her sternal area.  She can use triamcinolone for that and update me if not better.  Routine cautions given  Given the location and nature of the rash in her groin/gluteal crease, she could have scabies.  No known exposure.  Routine cautions given.  Treat presumptively, rationale discussed with patient.  She agrees.  Update me as needed.

## 2018-11-15 NOTE — Telephone Encounter (Signed)
Sent. Thanks.  Reasonable for CPE when possible.

## 2018-11-17 NOTE — Telephone Encounter (Signed)
Lab 9/29 @ 8:15am CPE on 01/18/19 @ 2:15pm

## 2019-01-11 ENCOUNTER — Other Ambulatory Visit: Payer: Self-pay

## 2019-01-11 ENCOUNTER — Other Ambulatory Visit: Payer: Self-pay | Admitting: Family Medicine

## 2019-01-11 ENCOUNTER — Other Ambulatory Visit (INDEPENDENT_AMBULATORY_CARE_PROVIDER_SITE_OTHER): Payer: BC Managed Care – PPO

## 2019-01-11 DIAGNOSIS — R739 Hyperglycemia, unspecified: Secondary | ICD-10-CM

## 2019-01-11 DIAGNOSIS — I10 Essential (primary) hypertension: Secondary | ICD-10-CM | POA: Diagnosis not present

## 2019-01-11 LAB — COMPREHENSIVE METABOLIC PANEL
ALT: 15 U/L (ref 0–35)
AST: 13 U/L (ref 0–37)
Albumin: 4.3 g/dL (ref 3.5–5.2)
Alkaline Phosphatase: 50 U/L (ref 39–117)
BUN: 14 mg/dL (ref 6–23)
CO2: 28 mEq/L (ref 19–32)
Calcium: 9.9 mg/dL (ref 8.4–10.5)
Chloride: 106 mEq/L (ref 96–112)
Creatinine, Ser: 0.72 mg/dL (ref 0.40–1.20)
GFR: 82.08 mL/min (ref >60.00)
Glucose, Bld: 112 mg/dL — ABNORMAL HIGH (ref 70–99)
Potassium: 4.8 mEq/L (ref 3.5–5.1)
Sodium: 140 mEq/L (ref 135–145)
Total Bilirubin: 0.4 mg/dL (ref 0.2–1.2)
Total Protein: 7 g/dL (ref 6.0–8.3)

## 2019-01-11 LAB — LIPID PANEL
Cholesterol: 156 mg/dL (ref 0–200)
HDL: 42.7 mg/dL (ref >39.00)
NonHDL: 113.71
Total CHOL/HDL Ratio: 4
Triglycerides: 201 mg/dL — ABNORMAL HIGH (ref 0.0–149.0)
VLDL: 40.2 mg/dL — ABNORMAL HIGH (ref 0.0–40.0)

## 2019-01-11 LAB — LDL CHOLESTEROL, DIRECT: Direct LDL: 95 mg/dL

## 2019-01-11 LAB — HEMOGLOBIN A1C: Hgb A1c MFr Bld: 6 % (ref 4.6–6.5)

## 2019-01-18 ENCOUNTER — Ambulatory Visit (INDEPENDENT_AMBULATORY_CARE_PROVIDER_SITE_OTHER): Payer: BC Managed Care – PPO | Admitting: Family Medicine

## 2019-01-18 ENCOUNTER — Other Ambulatory Visit: Payer: Self-pay

## 2019-01-18 ENCOUNTER — Encounter: Payer: Self-pay | Admitting: Family Medicine

## 2019-01-18 VITALS — BP 118/52 | HR 60 | Temp 97.9°F | Ht 63.25 in | Wt 168.5 lb

## 2019-01-18 DIAGNOSIS — Z0000 Encounter for general adult medical examination without abnormal findings: Secondary | ICD-10-CM | POA: Diagnosis not present

## 2019-01-18 DIAGNOSIS — I10 Essential (primary) hypertension: Secondary | ICD-10-CM

## 2019-01-18 DIAGNOSIS — Z7189 Other specified counseling: Secondary | ICD-10-CM

## 2019-01-18 MED ORDER — FLUTICASONE PROPIONATE 50 MCG/ACT NA SUSP: 2.0000 | Freq: Every day | NASAL | 12 refills | Status: DC

## 2019-01-18 NOTE — Patient Instructions (Addendum)
I would get a flu shot each fall.   Check with your insurance to see if they will cover the shingrix shot. Don't change your meds for now.  Update me as needed.  Take care.  Glad to see you.

## 2019-01-18 NOTE — Progress Notes (Signed)
CPE- See plan.  Routine anticipatory guidance given to patient.  See health maintenance.  The possibility exists that previously documented standard health maintenance information may have been brought forward from a previous encounter into this note.  If needed, that same information has been updated to reflect the current situation based on today's encounter.    Tetanus 2014 Flu encouraged.  Declined.   PNA due 66 Shingles d/w pt.   Mammogram 2020 DXA due at 66 Pap 2020 Colon cancer screening d/w pt. per patient she had completed Cologuard but I did not see that result yet.  I will check on this. Living will d/w pt.  Husband designated if patient were incapacitated.    Diet and exercise.  D/w pt about on both.  Hypertension:    Using medication without problems or lightheadedness: yes Chest pain with exertion:no Edema:no Short of breath:no Labs d/w pt.   PMH and SH reviewed  Meds, vitals, and allergies reviewed.   ROS: Per HPI.  Unless specifically indicated otherwise in HPI, the patient denies:  General: fever. Eyes: acute vision changes ENT: sore throat Cardiovascular: chest pain Respiratory: SOB GI: vomiting GU: dysuria Musculoskeletal: acute back pain Derm: acute rash Neuro: acute motor dysfunction Psych: worsening mood Endocrine: polydipsia Heme: bleeding Allergy: hayfever  GEN: nad, alert and oriented HEENT: ncat NECK: supple w/o LA CV: rrr. PULM: ctab, no inc wob ABD: soft, +bs EXT: no edema SKIN: no acute rash

## 2019-01-20 ENCOUNTER — Telehealth: Payer: Self-pay | Admitting: Family Medicine

## 2019-01-20 NOTE — Telephone Encounter (Signed)
This should go through to your CMA, we have nothing to do with cologaurd

## 2019-01-20 NOTE — Telephone Encounter (Signed)
Please talk to me about this patient.  She thought she had done Cologuard in 2020.  I do not see the results in the EMR.  Thanks.

## 2019-01-20 NOTE — Assessment & Plan Note (Signed)
Living will d/w pt.  Husband designated if patient were incapacitated.  

## 2019-01-20 NOTE — Assessment & Plan Note (Signed)
No change in meds.  Labs discussed with patient.  Blood pressure control.

## 2019-01-20 NOTE — Assessment & Plan Note (Signed)
Tetanus 2014 Flu encouraged.  Declined.   PNA due 66 Shingles d/w pt.   Mammogram 2020 DXA due at 66 Pap 2020 Colon cancer screening d/w pt. per patient she had completed Cologuard but I did not see that result yet.  I will check on this. Living will d/w pt.  Husband designated if patient were incapacitated.    Diet and exercise.  D/w pt about on both.

## 2019-01-21 NOTE — Telephone Encounter (Signed)
Dorna Leitz, CMA  You Just now (4:15 PM)   I called Cologuard & patient did have done 06/18/18 with negative result. Cologard was ordered by Dr. Marvel Plan at [facility].   I have abstracted in chart.     =========== Noted. Thanks.

## 2019-01-21 NOTE — Telephone Encounter (Signed)
Please talk to me about this.  The patient thought she had done a Cologuard.  I did not yet see the results.  I may need you to check with the [facility] to see if she has any pending results.  Thanks.

## 2019-02-25 NOTE — Patient Instructions (Addendum)
You need to get a covid test at [place]. Under the cement overhang. On 02/28/2019 the quarantine until you surgery   Miranda Willis        Your procedure is scheduled on  03/03/19   Report to [place]  At 5:30 AM  A.M.   Call this number if you have problems the morning of [phone]    OUR ADDRESS IS [place], WE ARE LOCATED IN THE [facility]ICAL PLA[facility] WITH [facility].   Remember:  Do not eat food or drink liquids after midnight.   Take these medicines the morning of surgery with A SIP OF WATER Claritin, Flonase,   Do not wear jewelry, make-up or nail polish.  Do not wear lotions, powders, or perfumes, or deoderant.  Do not shave 48 hours prior to surgery.  Men may shave face and neck.  Do not bring valuables to the hospital.  [facility] is not responsible for any belongings or valuables.  Contacts, dentures or bridgework may not be worn into surgery.    For patients admitted to the hospital, discharge time will be determined by your treatment team.  Patients discharged the day of surgery will not be allowed to drive home.   Special instructions:   Please read over the following fact sheets that you were given:       [facility] - Preparing for Surgery  Before surgery, you can play an important role.   Because skin is not sterile, your skin needs to be as free of germs as possible.   You can reduce the number of germs on your skin by washing with CHG (chlorahexidine gluconate) soap before surgery.   CHG is an antiseptic cleaner which kills germs and bonds with the skin to continue killing germs even after washing. Please DO NOT use if you have an allergy to CHG or antibacterial soaps .  If your skin becomes reddened/irritated stop using the CHG and inform your nurse when you arrive at Short Stay. Do not shave (including legs and underarms) for at least 48 hours prior to the first CHG shower.   Please follow  these instructions carefully:  1.  Shower with CHG Soap the night before surgery and the  morning of Surgery.  2.  If you choose to wash your hair, wash your hair first as usual with your  normal  shampoo.  3.  After you shampoo, rinse your hair and body thoroughly to remove the  shampoo.                                        4.  Use CHG as you would any other liquid soap.  You can apply chg directly  to the skin and wash                       Gently with a scrungie or clean washcloth.  5.  Apply the CHG Soap to your body ONLY FROM THE NECK DOWN.   Do not use on face/ open                           Wound or open sores. Avoid contact with eyes, ears mouth and genitals (private parts).  Wash face,  Genitals (private parts) with your normal soap.             6.  Wash thoroughly, paying special attention to the area where your surgery  will be performed.  7.  Thoroughly rinse your body with warm water from the neck down.  8.  DO NOT shower/wash with your normal soap after using and rinsing off  the CHG Soap.                9.  Pat yourself dry with a clean towel.            10.  Wear clean pajamas.            11.  Place clean sheets on your bed the night of your first shower and do not  sleep with pets. Day of Surgery : Do not apply any lotions/deodorants the morning of surgery.  Please wear clean clothes to the hospital/surgery center.  FAILURE TO FOLLOW THESE INSTRUCTIONS MAY RESULT IN THE CANCELLATION OF YOUR SURGERY PATIENT SIGNATURE_________________________________  NURSE SIGNATURE__________________________________  ________________________________________________________________________

## 2019-02-28 ENCOUNTER — Other Ambulatory Visit: Payer: Self-pay

## 2019-02-28 ENCOUNTER — Encounter (INDEPENDENT_AMBULATORY_CARE_PROVIDER_SITE_OTHER): Payer: Self-pay

## 2019-02-28 ENCOUNTER — Encounter (HOSPITAL_COMMUNITY)
Admission: RE | Admit: 2019-02-28 | Discharge: 2019-02-28 | Disposition: A | Discharge status: Home / Self Care | Payer: BC Managed Care – PPO | Source: Ambulatory Visit | Attending: Obstetrics and Gynecology | Admitting: Obstetrics and Gynecology

## 2019-02-28 ENCOUNTER — Other Ambulatory Visit (HOSPITAL_COMMUNITY)
Admission: RE | Admit: 2019-02-28 | Discharge: 2019-02-28 | Disposition: A | Discharge status: Home / Self Care | Payer: BC Managed Care – PPO | Source: Ambulatory Visit | Attending: Obstetrics and Gynecology | Admitting: Obstetrics and Gynecology

## 2019-02-28 ENCOUNTER — Encounter (HOSPITAL_COMMUNITY): Payer: Self-pay

## 2019-02-28 DIAGNOSIS — Z20828 Contact with and (suspected) exposure to other viral communicable diseases: Secondary | ICD-10-CM | POA: Insufficient documentation

## 2019-02-28 DIAGNOSIS — Z01810 Encounter for preprocedural cardiovascular examination: Secondary | ICD-10-CM | POA: Insufficient documentation

## 2019-02-28 DIAGNOSIS — Z01812 Encounter for preprocedural laboratory examination: Secondary | ICD-10-CM | POA: Insufficient documentation

## 2019-02-28 HISTORY — DX: Other complications of anesthesia, initial encounter: T88.59XA

## 2019-02-28 HISTORY — DX: Other specified postprocedural states: Z98.890

## 2019-02-28 HISTORY — DX: Nausea with vomiting, unspecified: R11.2

## 2019-02-28 LAB — CBC
HCT: 41 % (ref 36.0–46.0)
Hemoglobin: 12.8 g/dL (ref 12.0–15.0)
MCH: 27.5 pg (ref 26.0–34.0)
MCHC: 31.2 g/dL (ref 30.0–36.0)
MCV: 88.2 fL (ref 80.0–100.0)
Platelets: 245 10*3/uL (ref 150–400)
RBC: 4.65 MIL/uL (ref 3.87–5.11)
RDW: 12.6 % (ref 11.5–15.5)
WBC: 5.9 10*3/uL (ref 4.0–10.5)
nRBC: 0 % (ref 0.0–0.2)

## 2019-02-28 LAB — BASIC METABOLIC PANEL
Anion gap: 7 (ref 5–15)
BUN: 15 mg/dL (ref 8–23)
CO2: 25 mmol/L (ref 22–32)
Calcium: 9.6 mg/dL (ref 8.9–10.3)
Chloride: 107 mmol/L (ref 98–111)
Creatinine, Ser: 0.84 mg/dL (ref 0.44–1.00)
GFR calc Af Amer: >60 mL/min (ref >60)
GFR calc non Af Amer: >60 mL/min (ref >60)
Glucose, Bld: 111 mg/dL — ABNORMAL HIGH (ref 70–99)
Potassium: 5 mmol/L (ref 3.5–5.1)
Sodium: 139 mmol/L (ref 135–145)

## 2019-02-28 LAB — ABO/RH: ABO/RH(D): O POS

## 2019-02-28 NOTE — Progress Notes (Signed)
PCP - Dr. Elsie Stain Cardiologist -   Chest x-ray -  no EKG - 02/28/19 Stress Test - no ECHO - no Cardiac Cath - no  Sleep Study - no CPAP -   Fasting Blood Sugar - NA Checks Blood Sugar _____ times a day  Blood Thinner Instructions:NA Aspirin Instructions: Last Dose:  Anesthesia review:   Patient denies shortness of breath, fever, cough and chest pain at PAT appointment yes  Patient verbalized understanding of instructions that were given to them at the PAT appointment. Patient was also instructed that they will need to review over the PAT instructions again at home before surgery. yes

## 2019-03-01 LAB — NOVEL CORONAVIRUS, NAA (HOSP ORDER, SEND-OUT TO REF LAB; TAT 18-24 HRS): SARS-CoV-2, NAA: NOT DETECTED

## 2019-03-02 NOTE — Anesthesia Preprocedure Evaluation (Addendum)
Anesthesia Evaluation  Patient identified by MRN, date of birth, ID band Patient awake    Reviewed: Allergy & Precautions, NPO status , Patient's Chart, lab work & pertinent test results  History of Anesthesia Complications (+) PONV and history of anesthetic complications  Airway Mallampati: II  TM Distance: >3 FB Neck ROM: Full    Dental  (+) Dental Advisory Given, Teeth Intact, Missing   Pulmonary neg pulmonary ROS,    Pulmonary exam normal breath sounds clear to auscultation       Cardiovascular hypertension, Pt. on medications Normal cardiovascular exam Rhythm:Regular Rate:Normal     Neuro/Psych negative neurological ROS  negative psych ROS   GI/Hepatic negative GI ROS, Neg liver ROS,   Endo/Other  negative endocrine ROS  Renal/GU negative Renal ROS     Musculoskeletal  (+) Arthritis ,   Abdominal   Peds  Hematology negative hematology ROS (+)   Anesthesia Other Findings   Reproductive/Obstetrics negative OB ROS                           Anesthesia Physical Anesthesia Plan  ASA: II  Anesthesia Plan: General   Post-op Pain Management:    Induction: Intravenous  PONV Risk Score and Plan: 4 or greater and Ondansetron, Dexamethasone, Treatment may vary due to age or medical condition, Scopolamine patch - Pre-op and Midazolam  Airway Management Planned: LMA  Additional Equipment: None  Intra-op Plan:   Post-operative Plan: Extubation in OR  Informed Consent: I have reviewed the patients History and Physical, chart, labs and discussed the procedure including the risks, benefits and alternatives for the proposed anesthesia with the patient or authorized representative who has indicated his/her understanding and acceptance.     Dental advisory given  Plan Discussed with: CRNA  Anesthesia Plan Comments:        Anesthesia Quick Evaluation

## 2019-03-02 NOTE — H&P (Signed)
Miranda Willis is an 63 y.o. female G4 P3 who presents for scheduled TVH/possible BS/Anterior and Posterior Repair/Cystoscopy for prolapse.  The prolapse has been present for years but has recently become more symptomatic, interfering with sex and causing daily discomfort.  She had a pessary trial but was uncomfortable and increased urinary symptoms so a urologist was consulted but felt she would not need a concurrent PV Sling to prevent worsening incontinence.  Pertinent Gynecological History:  OB History: NSVD x 3                     SAB x 1   Menstrual History:  No LMP recorded. Patient is postmenopausal.    Past Medical History:  Diagnosis Date  . Complication of anesthesia   . Hypertension   . Osteoarthritis   . PONV (postoperative nausea and vomiting)     Past Surgical History:  Procedure Laterality Date  . BACK SURGERY  1997  . DILATION AND CURETTAGE OF UTERUS  1985   due to miscarriage  . EYE SURGERY Bilateral 2016  . NOSE SURGERY    . SPINE SURGERY  06/1996   back surgery L5-S1 Dr Jenean Lindau    Family History  Problem Relation Age of Onset  . Heart disease Mother        MI  . Depression Mother   . Heart disease Father        MI  . Depression Father   . Heart disease Maternal Grandmother        MI and CAD  . Depression Paternal Grandfather   . Colon cancer Neg Hx   . Breast cancer Neg Hx     Social History:  reports that she has never smoked. She has never used smokeless tobacco. She reports that she does not drink alcohol or use drugs.  Allergies: No Known Allergies  No medications prior to admission.    Review of Systems  Constitutional: Negative for fever.  Gastrointestinal: Negative for abdominal pain.  Genitourinary: Positive for urgency.    There were no vitals taken for this visit. Physical Exam  Constitutional: She appears well-developed.  Cardiovascular: Normal rate and regular rhythm.  Respiratory: Effort normal.  GI: Soft.   Genitourinary:    Genitourinary Comments: Vulva: no erythema, no lesions, no vesicles, no masses, no tenderness, atrophy (cystocele visible at introitus) Vagina: no erythema, no vesicles, no tenderness, cystocele 4th degree, rectocele 1st degree, atrophy mild (About 4-5 cm portion of bladder visible at introitus) Cervix: grossly normal, no discharge, no cervical motion tenderness Uterus: normal size, normal contour, midline, uterine prolapse 3rd degree Bladder/Urethra: normal meatus Adnexa/Parametria no mass palpable, no tenderness   Neurological: She is alert.  Psychiatric: She has a normal mood and affect.    No results found for this or any previous visit (from the past 24 hour(s)).  No results found.  Assessment/Plan: The patient was counseled regarding the risks of vaginal hysterectomy/possible BS with anterior and posterior repair. followed by cystoscopy. The procedure was reviewed in detail and expectations regarding recovery. Risks of bleeding, infection and possible damage to bowel and bladder were reviewed. The patient understands that should a complication arise she would likely need an abdominal incision and that this would delay her recovery. She would accept a blood transfusion if needed. We also discussed removal of the fallopian tubes as a means of possibly reducing future risk of ovarian cancer and she is agreeable to this if they are visible, we will not struggle  to remove them. She will retain her ovaries unless there is obvious pathology the would warrant removal. She is ready to proceed.  Logan Bores 03/02/2019, 9:41 AM

## 2019-03-03 ENCOUNTER — Encounter (HOSPITAL_BASED_OUTPATIENT_CLINIC_OR_DEPARTMENT_OTHER): Payer: Self-pay

## 2019-03-03 ENCOUNTER — Ambulatory Visit (HOSPITAL_BASED_OUTPATIENT_CLINIC_OR_DEPARTMENT_OTHER): Payer: BC Managed Care – PPO | Admitting: Physician Assistant

## 2019-03-03 ENCOUNTER — Ambulatory Visit (HOSPITAL_BASED_OUTPATIENT_CLINIC_OR_DEPARTMENT_OTHER): Payer: BC Managed Care – PPO | Admitting: Anesthesiology

## 2019-03-03 ENCOUNTER — Ambulatory Visit (HOSPITAL_COMMUNITY): Payer: BC Managed Care – PPO

## 2019-03-03 ENCOUNTER — Encounter (HOSPITAL_BASED_OUTPATIENT_CLINIC_OR_DEPARTMENT_OTHER)
Admission: RE | Disposition: A | Discharge status: Home / Self Care | Payer: Self-pay | Source: Home / Self Care | Attending: Obstetrics and Gynecology

## 2019-03-03 ENCOUNTER — Ambulatory Visit (HOSPITAL_BASED_OUTPATIENT_CLINIC_OR_DEPARTMENT_OTHER)
Admission: RE | Admit: 2019-03-03 | Discharge: 2019-03-04 | Disposition: A | Discharge status: Home / Self Care | Payer: BC Managed Care – PPO | Attending: Obstetrics and Gynecology | Admitting: Obstetrics and Gynecology

## 2019-03-03 DIAGNOSIS — N880 Leukoplakia of cervix uteri: Secondary | ICD-10-CM | POA: Insufficient documentation

## 2019-03-03 DIAGNOSIS — D251 Intramural leiomyoma of uterus: Secondary | ICD-10-CM | POA: Insufficient documentation

## 2019-03-03 DIAGNOSIS — N813 Complete uterovaginal prolapse: Secondary | ICD-10-CM | POA: Diagnosis not present

## 2019-03-03 DIAGNOSIS — N814 Uterovaginal prolapse, unspecified: Secondary | ICD-10-CM | POA: Diagnosis present

## 2019-03-03 DIAGNOSIS — Z8249 Family history of ischemic heart disease and other diseases of the circulatory system: Secondary | ICD-10-CM | POA: Insufficient documentation

## 2019-03-03 DIAGNOSIS — M1990 Unspecified osteoarthritis, unspecified site: Secondary | ICD-10-CM | POA: Diagnosis not present

## 2019-03-03 DIAGNOSIS — I10 Essential (primary) hypertension: Secondary | ICD-10-CM | POA: Diagnosis not present

## 2019-03-03 DIAGNOSIS — Z90710 Acquired absence of both cervix and uterus: Secondary | ICD-10-CM | POA: Diagnosis present

## 2019-03-03 HISTORY — PX: CYSTOSCOPY: SHX5120

## 2019-03-03 HISTORY — PX: VAGINAL HYSTERECTOMY: SHX2639

## 2019-03-03 HISTORY — PX: CYSTOSCOPY WITH URETEROSCOPY AND STENT PLACEMENT: SHX6377

## 2019-03-03 HISTORY — PX: ANTERIOR AND POSTERIOR REPAIR: SHX5121

## 2019-03-03 LAB — TYPE AND SCREEN
ABO/RH(D): O POS
Antibody Screen: NEGATIVE

## 2019-03-03 SURGERY — HYSTERECTOMY, VAGINAL
Anesthesia: General | Site: Uterus

## 2019-03-03 MED ORDER — CEFAZOLIN SODIUM-DEXTROSE 2-4 GM/100ML-% IV SOLN
2.0000 g | INTRAVENOUS | Status: AC
  Administered 2019-03-03: 2 g via INTRAVENOUS
  Filled 2019-03-03: qty 100

## 2019-03-03 MED ORDER — FLUORESCEIN SODIUM 10 % IV SOLN
INTRAVENOUS | Status: DC | PRN
  Administered 2019-03-03: .2 mL via INTRAVENOUS
  Administered 2019-03-03: .5 mL via INTRAVENOUS
  Administered 2019-03-03: .3 mL via INTRAVENOUS

## 2019-03-03 MED ORDER — OXYCODONE HCL 5 MG PO TABS
ORAL_TABLET | ORAL | Status: AC
  Filled 2019-03-03: qty 1

## 2019-03-03 MED ORDER — VASOPRESSIN 20 UNIT/ML IV SOLN
INTRAVENOUS | Status: DC | PRN
  Administered 2019-03-03: 11 mL via INTRAMUSCULAR

## 2019-03-03 MED ORDER — EPHEDRINE 5 MG/ML INJ
INTRAVENOUS | Status: AC
  Filled 2019-03-03: qty 10

## 2019-03-03 MED ORDER — KETOROLAC TROMETHAMINE 30 MG/ML IJ SOLN
INTRAMUSCULAR | Status: AC
  Filled 2019-03-03: qty 1

## 2019-03-03 MED ORDER — KETOROLAC TROMETHAMINE 30 MG/ML IJ SOLN
INTRAMUSCULAR | Status: DC | PRN
  Administered 2019-03-03: 30 mg via INTRAVENOUS

## 2019-03-03 MED ORDER — VASOPRESSIN 20 UNIT/ML IV SOLN
INTRAVENOUS | Status: AC
  Filled 2019-03-03: qty 1

## 2019-03-03 MED ORDER — ACETAMINOPHEN 500 MG PO TABS
ORAL_TABLET | ORAL | Status: AC
  Filled 2019-03-03: qty 2

## 2019-03-03 MED ORDER — FLUTICASONE PROPIONATE 50 MCG/ACT NA SUSP
2.0000 | Freq: Every day | NASAL | Status: DC | PRN
  Filled 2019-03-03: qty 16

## 2019-03-03 MED ORDER — LIDOCAINE 2% (20 MG/ML) 5 ML SYRINGE
INTRAMUSCULAR | Status: DC | PRN
  Administered 2019-03-03: 100 mg via INTRAVENOUS

## 2019-03-03 MED ORDER — FLUORESCEIN SODIUM 10 % IV SOLN
INTRAVENOUS | Status: AC
  Filled 2019-03-03: qty 5

## 2019-03-03 MED ORDER — HYDROMORPHONE HCL 1 MG/ML IJ SOLN
INTRAMUSCULAR | Status: AC
  Filled 2019-03-03: qty 1

## 2019-03-03 MED ORDER — PANTOPRAZOLE SODIUM 40 MG PO TBEC
40.0000 mg | DELAYED_RELEASE_TABLET | Freq: Every day | ORAL | Status: DC
  Administered 2019-03-03: 15:00:00 40 mg via ORAL
  Filled 2019-03-03: qty 1

## 2019-03-03 MED ORDER — LACTATED RINGERS IV SOLN
INTRAVENOUS | Status: DC
  Administered 2019-03-03 (×3): via INTRAVENOUS
  Filled 2019-03-03: qty 1000

## 2019-03-03 MED ORDER — ONDANSETRON HCL 4 MG/2ML IJ SOLN
4.0000 mg | Freq: Four times a day (QID) | INTRAMUSCULAR | Status: DC | PRN
  Administered 2019-03-03: 17:00:00 4 mg via INTRAVENOUS
  Filled 2019-03-03: qty 2

## 2019-03-03 MED ORDER — SODIUM CHLORIDE 0.9 % IV SOLN
INTRAVENOUS | Status: DC | PRN
  Administered 2019-03-03: 5 mL

## 2019-03-03 MED ORDER — ONDANSETRON HCL 4 MG/2ML IJ SOLN
INTRAMUSCULAR | Status: AC
  Filled 2019-03-03: qty 2

## 2019-03-03 MED ORDER — OXYCODONE HCL 5 MG PO TABS
5.0000 mg | ORAL_TABLET | ORAL | Status: DC | PRN
  Administered 2019-03-03 – 2019-03-04 (×6): 5 mg via ORAL
  Filled 2019-03-03: qty 2

## 2019-03-03 MED ORDER — ACETAMINOPHEN 325 MG PO TABS
650.0000 mg | ORAL_TABLET | ORAL | Status: DC | PRN
  Filled 2019-03-03: qty 2

## 2019-03-03 MED ORDER — FENTANYL CITRATE (PF) 100 MCG/2ML IJ SOLN
INTRAMUSCULAR | Status: DC | PRN
  Administered 2019-03-03 (×4): 25 ug via INTRAVENOUS
  Administered 2019-03-03: 50 ug via INTRAVENOUS
  Administered 2019-03-03: 25 ug via INTRAVENOUS
  Administered 2019-03-03 (×3): 50 ug via INTRAVENOUS
  Administered 2019-03-03: 25 ug via INTRAVENOUS

## 2019-03-03 MED ORDER — HYDROMORPHONE HCL 1 MG/ML IJ SOLN
0.2000 mg | INTRAMUSCULAR | Status: DC | PRN
  Filled 2019-03-03: qty 1

## 2019-03-03 MED ORDER — DEXAMETHASONE SODIUM PHOSPHATE 10 MG/ML IJ SOLN
INTRAMUSCULAR | Status: AC
  Filled 2019-03-03: qty 1

## 2019-03-03 MED ORDER — DEXAMETHASONE SODIUM PHOSPHATE 10 MG/ML IJ SOLN
INTRAMUSCULAR | Status: DC | PRN
  Administered 2019-03-03: 10 mg via INTRAVENOUS

## 2019-03-03 MED ORDER — BUPIVACAINE HCL (PF) 0.5 % IJ SOLN
INTRAMUSCULAR | Status: AC
  Filled 2019-03-03: qty 30

## 2019-03-03 MED ORDER — EPHEDRINE SULFATE-NACL 50-0.9 MG/10ML-% IV SOSY
PREFILLED_SYRINGE | INTRAVENOUS | Status: DC | PRN
  Administered 2019-03-03: 5 mg via INTRAVENOUS
  Administered 2019-03-03 (×2): 10 mg via INTRAVENOUS

## 2019-03-03 MED ORDER — IBUPROFEN 800 MG PO TABS
800.0000 mg | ORAL_TABLET | Freq: Three times a day (TID) | ORAL | Status: DC
  Administered 2019-03-03 – 2019-03-04 (×3): 800 mg via ORAL
  Filled 2019-03-03: qty 1

## 2019-03-03 MED ORDER — PROMETHAZINE HCL 25 MG/ML IJ SOLN: 6.2500 mg | INTRAMUSCULAR | Status: DC | PRN

## 2019-03-03 MED ORDER — CEFAZOLIN SODIUM-DEXTROSE 2-4 GM/100ML-% IV SOLN
INTRAVENOUS | Status: AC
  Filled 2019-03-03: qty 100

## 2019-03-03 MED ORDER — PROPOFOL 10 MG/ML IV BOLUS
INTRAVENOUS | Status: AC
  Filled 2019-03-03: qty 40

## 2019-03-03 MED ORDER — ONDANSETRON HCL 4 MG/2ML IJ SOLN
INTRAMUSCULAR | Status: DC | PRN
  Administered 2019-03-03: 4 mg via INTRAVENOUS

## 2019-03-03 MED ORDER — LIDOCAINE 2% (20 MG/ML) 5 ML SYRINGE
INTRAMUSCULAR | Status: AC
  Filled 2019-03-03: qty 5

## 2019-03-03 MED ORDER — ONDANSETRON HCL 4 MG PO TABS
4.0000 mg | ORAL_TABLET | Freq: Four times a day (QID) | ORAL | Status: DC | PRN
  Filled 2019-03-03: qty 1

## 2019-03-03 MED ORDER — MEPERIDINE HCL 25 MG/ML IJ SOLN
6.2500 mg | INTRAMUSCULAR | Status: DC | PRN
  Filled 2019-03-03: qty 1

## 2019-03-03 MED ORDER — SCOPOLAMINE 1 MG/3DAYS TD PT72
MEDICATED_PATCH | TRANSDERMAL | Status: DC | PRN
  Administered 2019-03-03: 1 via TRANSDERMAL

## 2019-03-03 MED ORDER — PANTOPRAZOLE SODIUM 40 MG PO TBEC
DELAYED_RELEASE_TABLET | ORAL | Status: AC
  Filled 2019-03-03: qty 1

## 2019-03-03 MED ORDER — LACTATED RINGERS IV BOLUS
500.0000 mL | Freq: Once | INTRAVENOUS | Status: DC
  Filled 2019-03-03: qty 500

## 2019-03-03 MED ORDER — MIDAZOLAM HCL 2 MG/2ML IJ SOLN
INTRAMUSCULAR | Status: AC
  Filled 2019-03-03: qty 2

## 2019-03-03 MED ORDER — FENTANYL CITRATE (PF) 100 MCG/2ML IJ SOLN
INTRAMUSCULAR | Status: AC
  Filled 2019-03-03: qty 2

## 2019-03-03 MED ORDER — LISINOPRIL 10 MG PO TABS
10.0000 mg | ORAL_TABLET | Freq: Every day | ORAL | Status: DC
  Administered 2019-03-03: 15:00:00 10 mg via ORAL
  Filled 2019-03-03: qty 1

## 2019-03-03 MED ORDER — ACETAMINOPHEN 325 MG PO TABS
ORAL_TABLET | ORAL | Status: DC | PRN
  Administered 2019-03-03: 1000 mg via ORAL

## 2019-03-03 MED ORDER — PROPOFOL 10 MG/ML IV BOLUS
INTRAVENOUS | Status: DC | PRN
  Administered 2019-03-03: 20 mg via INTRAVENOUS
  Administered 2019-03-03: 150 mg via INTRAVENOUS

## 2019-03-03 MED ORDER — SIMETHICONE 80 MG PO CHEW
80.0000 mg | CHEWABLE_TABLET | Freq: Four times a day (QID) | ORAL | Status: DC | PRN
  Filled 2019-03-03: qty 1

## 2019-03-03 MED ORDER — FENTANYL CITRATE (PF) 250 MCG/5ML IJ SOLN
INTRAMUSCULAR | Status: AC
  Filled 2019-03-03: qty 5

## 2019-03-03 MED ORDER — SODIUM CHLORIDE 0.9 % IR SOLN
Status: DC | PRN
  Administered 2019-03-03: 3000 mL via INTRAVESICAL

## 2019-03-03 MED ORDER — MENTHOL 3 MG MT LOZG
1.0000 | LOZENGE | OROMUCOSAL | Status: DC | PRN
  Filled 2019-03-03: qty 9

## 2019-03-03 MED ORDER — SCOPOLAMINE 1 MG/3DAYS TD PT72
MEDICATED_PATCH | TRANSDERMAL | Status: AC
  Filled 2019-03-03: qty 1

## 2019-03-03 MED ORDER — IBUPROFEN 800 MG PO TABS
ORAL_TABLET | ORAL | Status: AC
  Filled 2019-03-03: qty 1

## 2019-03-03 MED ORDER — 0.9 % SODIUM CHLORIDE (POUR BTL) OPTIME
TOPICAL | Status: DC | PRN
  Administered 2019-03-03: 1000 mL

## 2019-03-03 MED ORDER — MIDAZOLAM HCL 5 MG/5ML IJ SOLN
INTRAMUSCULAR | Status: DC | PRN
  Administered 2019-03-03: 2 mg via INTRAVENOUS

## 2019-03-03 MED ORDER — ESTRADIOL 0.1 MG/GM VA CREA
TOPICAL_CREAM | VAGINAL | Status: AC
  Filled 2019-03-03: qty 42.5

## 2019-03-03 MED ORDER — HYDROMORPHONE HCL 1 MG/ML IJ SOLN
0.2500 mg | INTRAMUSCULAR | Status: DC | PRN
  Administered 2019-03-03 (×3): 0.25 mg via INTRAVENOUS
  Filled 2019-03-03: qty 0.5

## 2019-03-03 MED ORDER — LACTATED RINGERS IV SOLN
INTRAVENOUS | Status: DC
  Filled 2019-03-03: qty 1000

## 2019-03-03 SURGICAL SUPPLY — 37 items
BLADE SURG 15 STRL LF DISP TIS (BLADE) IMPLANT
BLADE SURG 15 STRL SS (BLADE)
CANISTER SUCT 3000ML PPV (MISCELLANEOUS) ×4 IMPLANT
CONT SPEC 4OZ CLIKSEAL STRL BL (MISCELLANEOUS) IMPLANT
COVER WAND RF STERILE (DRAPES) ×4 IMPLANT
DECANTER SPIKE VIAL GLASS SM (MISCELLANEOUS) IMPLANT
GAUZE 4X4 16PLY RFD (DISPOSABLE) ×8 IMPLANT
GAUZE PACKING 2X5 YD STRL (GAUZE/BANDAGES/DRESSINGS) ×4 IMPLANT
GLOVE BIO SURGEON STRL SZ7 (GLOVE) ×4 IMPLANT
GLOVE BIOGEL PI IND STRL 6.5 (GLOVE) ×3 IMPLANT
GLOVE BIOGEL PI INDICATOR 6.5 (GLOVE) ×1
GLOVE ORTHO TXT STRL SZ7.5 (GLOVE) ×4 IMPLANT
GOWN STRL REUS W/TWL LRG LVL3 (GOWN DISPOSABLE) ×20 IMPLANT
GUIDEWIRE ANG ZIPWIRE 038X150 (WIRE) ×4 IMPLANT
GUIDEWIRE ASNIS 2.0 100 NS (WIRE) ×4 IMPLANT
NEEDLE HYPO 22GX1.5 SAFETY (NEEDLE) IMPLANT
NEEDLE MAYO CATGUT SZ4 (NEEDLE) IMPLANT
NEEDLE SPNL 22GX3.5 QUINCKE BK (NEEDLE) IMPLANT
NS IRRIG 1000ML POUR BTL (IV SOLUTION) ×4 IMPLANT
PACK VAGINAL WOMENS (CUSTOM PROCEDURE TRAY) ×4 IMPLANT
PAD OB MATERNITY 4.3X12.25 (PERSONAL CARE ITEMS) ×4 IMPLANT
SET IRRIG Y TYPE TUR BLADDER L (SET/KITS/TRAYS/PACK) ×4 IMPLANT
SUT PROLENE 1 CT 1 30 (SUTURE) IMPLANT
SUT VIC AB 0 CT1 18XCR BRD8 (SUTURE) ×9 IMPLANT
SUT VIC AB 0 CT1 27 (SUTURE) ×1
SUT VIC AB 0 CT1 27XBRD ANBCTR (SUTURE) ×3 IMPLANT
SUT VIC AB 0 CT1 8-18 (SUTURE) ×3
SUT VIC AB 2-0 CT1 27 (SUTURE) ×3
SUT VIC AB 2-0 CT1 TAPERPNT 27 (SUTURE) ×9 IMPLANT
SUT VIC AB 2-0 UR5 27 (SUTURE) IMPLANT
SUT VIC AB 3-0 SH 27 (SUTURE) ×2
SUT VIC AB 3-0 SH 27X BRD (SUTURE) ×6 IMPLANT
SUT VICRYL 0 TIES 12 18 (SUTURE) ×4 IMPLANT
SYR 10ML LL (SYRINGE) ×4 IMPLANT
SYR CONTROL 10ML LL (SYRINGE) ×4 IMPLANT
TOWEL OR 17X26 10 PK STRL BLUE (TOWEL DISPOSABLE) ×8 IMPLANT
TRAY FOLEY W/BAG SLVR 14FR (SET/KITS/TRAYS/PACK) ×4 IMPLANT

## 2019-03-03 NOTE — Transfer of Care (Signed)
Immediate Anesthesia Transfer of Care Note  Patient: Miranda Willis  Procedure(s) Performed: HYSTERECTOMY VAGINAL (N/A Uterus) ANTERIOR (CYSTOCELE) AND POSTERIOR REPAIR (RECTOCELE) (N/A Bladder) CYSTOSCOPY (N/A Bladder) CYSTOSCOPY WITH URETEROSCOPY AND STENT PLACEMENT (Left Ureter) CYSTOSCOPY WITH URETEROSCOPY AND STENT PLACEMENT (Left Ureter)  Patient Location: PACU  Anesthesia Type:General  Level of Consciousness: drowsy  Airway & Oxygen Therapy: Patient Spontanous Breathing and Patient connected to nasal cannula oxygen  Post-op Assessment: Report given to RN  Post vital signs: Reviewed and stable  Last Vitals:  Vitals Value Taken Time  BP 104/50 03/03/19 1145  Temp    Pulse 98 03/03/19 1145  Resp 23 03/03/19 1145  SpO2 99 % 03/03/19 1145  Vitals shown include unvalidated device data.  Last Pain:  Vitals:   03/03/19 0557  PainSc: 0-No pain      Patients Stated Pain Goal: 5 ([id number])  Complications: No apparent anesthesia complications

## 2019-03-03 NOTE — Op Note (Signed)
.  Preoperative diagnosis: concern for left ureteral injury  Postoperative diagnosis: Same  Procedure: 1 cystoscopy 2. Left retrograde pyelography 3.  Intraoperative fluoroscopy, under one hour, with interpretation 4. Left 6 x 24 JJ stent placement  Attending: Nicolette Bang  Anesthesia: General  Estimated blood loss: None  Drains: Left 6 x 24 JJ ureteral stent without tether, 16 French foley catheter  Specimens: none  Findings:. No masses/lesions in the bladder. Ureteral orifices in laterally displaced after prolapse repair. Bloody efflux fro left ureter. No evidence of contrast extravasation. No hydronephrisis  Indications: Patient is a 63 year old female with a history of cystocele who underwent cystocele repair and was noted to have no efflux from the left ureter.  Urology was consulted for possible ureteral injury.   Procedure her in detail:  Their genitalia was then prepped and draped in usual sterile fashion.  A rigid 73 French cystoscope was passed in the urethra and the bladder.  Bladder was inspected free masses or lesions.  the ureteral orifices were laterally displaced after cystocele repair. Bloody efflux was noted from the left ureteral orifice.  a 5 french ureteral catheter was then instilled into the left ureteral orifice.  a gentle retrograde was obtained and findings noted above.  we then placed a zip wire through the ureteral catheter and advanced up to the renal pelvis.    We then placed a 6 x 24 double-j ureteral stent over the original zip wire.  We then removed the wire and good coil was noted in the the renal pelvis under fluoroscopy and the bladder under direct vision.  A foley catheter was then placed. the bladder was then drained and this concluded the procedure which was well tolerated by patient.  Complications: None  Condition: Stable, extubated, transferred to PACU  Plan: Patient is to be discharged home and followup in 2 weeks for stent removal

## 2019-03-03 NOTE — Progress Notes (Signed)
Patient ID: Miranda Willis, female   DOB: 1955-05-04, 63 y.o.   MRN: [id number] Per pt no changes in dictated H&P.  Brief exam WNL.  Ready to proceed.

## 2019-03-03 NOTE — Op Note (Signed)
Operative Note    Preoperative Diagnosis Uterine prolapse  Cystocele Rectocele  Postoperative Diagnosis Same with possible left ureteral trauma  Procedure Vaginal hysterectomy Anterior and posterior repair Cystoscopy with left ureteroscopy and stent placement   Surgeon Paula Compton, MD Eula Flax, MD Nicolette Bang, MD  Anesthesia LMA/GETA  Fluids: EBL 283mL UOP 568mL clear urine IVF 2200LR  Findings The patient had significant uterine prolapse and third degree cystocele.  There was a small rectocele.  At the conclusion of the surgery, fluoresceine was given and cystoscopy performed.  It took 20 minutes for the fluoresceine to be visualized and a right ureteral jet seen, but the left orifice could not be well-visualized.  Urology was consulted and confirmed the right jet normal.  The left ureter had a jet as well, but the fluid looked slightly bloody so ureteroscopy performed and no obvious injury seen.  A stent was placed given the blood-tinged fluid as precaution in case there was a suture close to ureter.   Specimen Uterus and cervix  Procedure Note Patient was taken to the operating room where general anesthesia with LMA was obtained without difficulty. She was then prepped and draped in the normal sterile fashion in the dorsal lithotomy position. An appropriate timeout was performed.  Attention was then turned to the vagina and the cervix was noted to be protruding at the introitus.  grasped with Yates Decamp tenaculums x 2 and injected with a dilute solution of Pitressin circumferentially.  The bovie was then used to make a circumferential incision.  The mayo scissors then further dissected the vaginal mucosa from the underlying cervix.  The cervix was very long and the dissection took longer than usual to reach the anterior and the  posterior cul de sac.  Eventually the anterior cul-de-sac was able to be entered and a deaver retractor placed there. Likewise, the  posterior cul-de-sac was entered after the cervix was dissected up to the uterine juncture.   With a banana speculum and deaver retractor isolating the uterus from the bladder and rectum.  The uterosacral ligaments and paracervical tissue was taken down sequentially with zeppelin clamps and suture ligated with zero vicryl at each step.  When the was uterus freed on the patient's right, it was then delivered and the remaining tissue on the left was visible and able to be cross-clamped with a zeppelin clamp and transected.  The pedicles were inspected and found to be hemostatic.    The uterus was handed off to pathology. The uterosacral ligaments were then approximated with zero vicryl.  The short weighted speculum was placed and the cuff run with a running locked 2-0 vicryl for hemostasis.  Attention was then turned to the anterior vaginal cuff which was grasped with alyss clamps and a midline incision begun after injection with dilute pitressin.  The vaginal mucosa was underscored and dilated off the pubo-vesicle fascia with the cystocele freed from the mucosa.  The fascia was then re-approximated with 0 vicryl interrupted sutures and the excess mucosa trimmed away.  The mucosa and the vaginal cuff were then closed with 2-0 vicryl in a running fashion.  Attention was finally turned posteriorly where the introitus was denuded and underscored in the midline with mayo scissors.  The vaginal mucosa was then dissected off the underlying puborectal fascia and reflected away from the midline.  The rectocele was then reduced and the puborectal fascia re-approximated with 0 vicryl interrupted sutures. The excess mucosa was then trimmed away and then closed with 2-0 vicryl  in a running locked suture.  A rectal exam was performed and normal.   The cystoscope was then introduced into the bladder and fluoresceine given.  It took about 20 minutes for the fluoresceine to be visible in the bladder and the right jet was clearly  seen.  The left ureter was harder to visualize due to position so urology was consulted.  Dr.Mckenzie was able to see the jet however the jet seem blood-tinged so retrograde ureteroscopy was performed and no obvious injury seen.  Given the  blood-tinged fluid he felt placing a stent would be a good precaution in case there was a suture close to the ureter.  This was placed without difficulty. All appeared hemostatic and estrace coated vaginal packing placed.  Sponge, instrument and needle counts were correct.   Patient was then awakened and taken to the recovery room in good condition.

## 2019-03-03 NOTE — Progress Notes (Signed)
Patient ID: Miranda Willis, female   DOB: Feb 08, 1956, 64 y.o.   MRN: [id number] DOS Vaginal hysterectomy/Anterior and posterior repair/left ureteral stent placement  Pt resting in bed and states pain controlled, feels low pelvic ache.  Has not yet ambulated because got nauseous with first attempt.  Tolerating po clears now and no emesis.  Was sleepy earlier  afeb VSS  Abdomen soft and vaginal bleeding minimal  UOP about 23ml-tea colored  Will give fluid bolus x 1 since not drinking very much throughout day D/w pt stent placement and surgery reviewed in detail. Aware will keep stent x 2 weeks and have removed in office. Will check labs in AM Remove packing and foley in AM if UOP adequate.

## 2019-03-04 DIAGNOSIS — N813 Complete uterovaginal prolapse: Secondary | ICD-10-CM | POA: Diagnosis not present

## 2019-03-04 LAB — BASIC METABOLIC PANEL
Anion gap: 10 (ref 5–15)
BUN: 14 mg/dL (ref 8–23)
CO2: 23 mmol/L (ref 22–32)
Calcium: 8.9 mg/dL (ref 8.9–10.3)
Chloride: 107 mmol/L (ref 98–111)
Creatinine, Ser: 0.83 mg/dL (ref 0.44–1.00)
GFR calc Af Amer: >60 mL/min (ref >60)
GFR calc non Af Amer: >60 mL/min (ref >60)
Glucose, Bld: 131 mg/dL — ABNORMAL HIGH (ref 70–99)
Potassium: 4.8 mmol/L (ref 3.5–5.1)
Sodium: 140 mmol/L (ref 135–145)

## 2019-03-04 LAB — CBC
HCT: 30.1 % — ABNORMAL LOW (ref 36.0–46.0)
Hemoglobin: 9.7 g/dL — ABNORMAL LOW (ref 12.0–15.0)
MCH: 28.6 pg (ref 26.0–34.0)
MCHC: 32.2 g/dL (ref 30.0–36.0)
MCV: 88.8 fL (ref 80.0–100.0)
Platelets: 200 10*3/uL (ref 150–400)
RBC: 3.39 MIL/uL — ABNORMAL LOW (ref 3.87–5.11)
RDW: 13.2 % (ref 11.5–15.5)
WBC: 12.2 10*3/uL — ABNORMAL HIGH (ref 4.0–10.5)
nRBC: 0 % (ref 0.0–0.2)

## 2019-03-04 LAB — SURGICAL PATHOLOGY

## 2019-03-04 MED ORDER — OXYCODONE HCL 5 MG PO TABS
ORAL_TABLET | ORAL | Status: AC
  Filled 2019-03-04: qty 1

## 2019-03-04 MED ORDER — IBUPROFEN 800 MG PO TABS
ORAL_TABLET | ORAL | Status: AC
  Filled 2019-03-04: qty 1

## 2019-03-04 MED ORDER — ACETAMINOPHEN 325 MG PO TABS: 650.0000 mg | ORAL_TABLET | ORAL | 1 refills | Status: DC | PRN

## 2019-03-04 MED ORDER — IBUPROFEN 800 MG PO TABS: 800.0000 mg | ORAL_TABLET | Freq: Three times a day (TID) | ORAL | 0 refills | Status: DC

## 2019-03-04 MED ORDER — OXYCODONE HCL 5 MG PO TABS: 5.0000 mg | ORAL_TABLET | ORAL | 0 refills | Status: DC | PRN

## 2019-03-04 NOTE — Progress Notes (Signed)
Patient ID: Miranda Willis, female   DOB: 06/27/1955, 63 y.o.   MRN: [id number] Pt had good night.  Up and dressed and ready to go home! No c/o except mild pain on left side where stent is but well-controlled.  Voided well s/p foley removal.  Tolerating po meds and diet  afeb vss UOP 1850/shift clearing Hgb 9.7 and creatinine .84 (stable)  Abdomen soft Vaginal packing removed by RN  Will d/c home F/u urology in 2 weeks for stent removal per Dr. Alyson Ingles F/u with me in 6 weeks

## 2019-03-04 NOTE — Discharge Summary (Signed)
Physician Discharge Summary  Patient ID: Miranda Willis MRN: [id number] DOB/AGE: 62-23-1957 63 y.o.  Admit date: 03/03/2019 Discharge date: 03/04/2019  Admission Diagnoses: Pelvic prolapse  Discharge Diagnoses:  Active Problems:   S/P vaginal hysterectomy s/p anterior/posterior repair and left ureteral stent placement  Discharged Condition: good  Hospital Course: Pt admitted to observation post-surgery and did well.  By post-op day #1 she was ambulating, tolerating a regular diet and po medications.  She was voiding well and had good urine output.  Labs were stable.    Consults: urology  Significant Diagnostic Studies: left ureteroscopy  Treatments: surgery:  S/P vaginal hysterectomy s/p anterior/posterior repair and left ureteral stent placement   Discharge Exam: Blood pressure (!) 141/49, pulse 68, temperature 98.6 F (37 C), resp. rate 16, height 5\' 4"  (1.626 m), weight 75.8 kg, SpO2 95 %. General appearance: alert and cooperative  Abdomen soft NT Minimal VB   Disposition: Discharge disposition: 01-Home or Self Care       Discharge Instructions    Call MD for:  persistant nausea and vomiting   Complete by: As directed    Call MD for:  redness, tenderness, or signs of infection (pain, swelling, redness, odor or green/yellow discharge around incision site)   Complete by: As directed    Call MD for:  severe uncontrolled pain   Complete by: As directed    Call MD for:  temperature >100.4   Complete by: As directed    Diet - low sodium heart healthy   Complete by: As directed    Discharge instructions   Complete by: As directed    Avoid driving for at least 1-2 weeks or until off narcotic pain meds.  No heavy lifting greater than 10 lbs.  Nothing in vagina for 6 weeks.   Lifting restrictions   Complete by: As directed    Weight restriction of 10 lbs.     Allergies as of 03/04/2019   No Known Allergies     Medication List    STOP taking these  medications   etodolac 400 MG tablet Commonly known as: LODINE   triamcinolone cream 0.1 % Commonly known as: KENALOG     TAKE these medications   acetaminophen 325 MG tablet Commonly known as: TYLENOL Take 2 tablets (650 mg total) by mouth every 4 (four) hours as needed for mild pain (temperature > 101.5.).   fluticasone 50 MCG/ACT nasal spray Commonly known as: FLONASE Place 2 sprays into both nostrils daily. What changed:   when to take this  reasons to take this   ibuprofen 800 MG tablet Commonly known as: ADVIL Take 1 tablet (800 mg total) by mouth every 8 (eight) hours.   lisinopril 10 MG tablet Commonly known as: ZESTRIL TAKE 1 TABLET BY MOUTH EVERY DAY   loratadine 10 MG tablet Commonly known as: CLARITIN TAKE 1 TABLET (10 MG TOTAL) BY MOUTH DAILY AS NEEDED. What changed: reasons to take this   oxyCODONE 5 MG immediate release tablet Commonly known as: Oxy IR/ROXICODONE Take 1-2 tablets (5-10 mg total) by mouth every 4 (four) hours as needed for moderate pain.      Follow-up Information    McKenzie, Candee Furbish, MD. Schedule an appointment as soon as possible for a visit in 2 week(s).   Specialty: Urology Why: Need office appointment in 2-3 weeks for stent removal from left ureter Contact information: [place] [address] [phone]        Paula Compton, MD.  Schedule an appointment as soon as possible for a visit in 6 week(s).   Specialty: Obstetrics and Gynecology Why: Postoperative exam Contact information: [place]  [address] [phone]           Signed: Logan Bores 03/04/2019, 8:28 AM

## 2019-03-04 NOTE — Progress Notes (Signed)
Vaginal packing removed with about half of gauze with sanguineous drg on it.  After packing removed foley catheter removed and peri care given and new pad and panties on. Tolerated procedure well

## 2019-03-04 NOTE — Anesthesia Procedure Notes (Signed)
Procedure Name: LMA Insertion Date/Time: 03/04/2019 7:25 AM Performed by: Mechele Claude, CRNA Pre-anesthesia Checklist: Patient identified, Emergency Drugs available, Suction available and Patient being monitored Patient Re-evaluated:Patient Re-evaluated prior to induction Oxygen Delivery Method: Circle system utilized Preoxygenation: Pre-oxygenation with 100% oxygen Induction Type: IV induction Ventilation: Mask ventilation without difficulty LMA: LMA inserted LMA Size: 4.0 Number of attempts: 1 Airway Equipment and Method: Bite block Placement Confirmation: positive ETCO2 Tube secured with: Tape Dental Injury: Teeth and Oropharynx as per pre-operative assessment

## 2019-03-06 NOTE — Anesthesia Postprocedure Evaluation (Signed)
Anesthesia Post Note  Patient: Miranda Willis  Procedure(s) Performed: HYSTERECTOMY VAGINAL (N/A Uterus) ANTERIOR (CYSTOCELE) AND POSTERIOR REPAIR (RECTOCELE) (N/A Bladder) CYSTOSCOPY (N/A Bladder) CYSTOSCOPY WITH URETEROSCOPY AND STENT PLACEMENT (Left Ureter) CYSTOSCOPY WITH URETEROSCOPY AND STENT PLACEMENT (Left Ureter)     Patient location during evaluation: PACU Anesthesia Type: General Level of consciousness: sedated and patient cooperative Pain management: pain level controlled Vital Signs Assessment: post-procedure vital signs reviewed and stable Respiratory status: spontaneous breathing Cardiovascular status: stable Anesthetic complications: no    Last Vitals:  Vitals:   03/04/19 0605 03/04/19 0845  BP: (!) 141/49 (!) 117/51  Pulse: 68 74  Resp: 16 18  Temp: 37 C 36.9 C  SpO2: 95% 95%    Last Pain:  Vitals:   03/04/19 0845  PainSc: 2                  Nolon Nations

## 2019-03-07 ENCOUNTER — Encounter (HOSPITAL_BASED_OUTPATIENT_CLINIC_OR_DEPARTMENT_OTHER): Payer: Self-pay | Admitting: Obstetrics and Gynecology

## 2019-03-19 ENCOUNTER — Other Ambulatory Visit: Payer: Self-pay | Admitting: Family Medicine

## 2019-03-21 NOTE — Telephone Encounter (Signed)
Electronic refill request. Triamcinolone Cream 0.1% Last office visit:   01/18/2019 Last Filled:   11/11/2018  30 grams   The original prescription was discontinued on 03/04/2019 by Paula Compton, MD for the following reason: Stop Taking at Discharge. Renewing this prescription may not be appropriate.

## 2019-03-22 NOTE — Telephone Encounter (Signed)
Left detailed message on voicemail of work #.  No answer at home number and no voicemail available.

## 2019-03-22 NOTE — Telephone Encounter (Signed)
Please get update from patient about this rx and her situation. Please let me know.  Thanks.

## 2019-07-13 ENCOUNTER — Other Ambulatory Visit: Payer: Self-pay | Admitting: Family Medicine

## 2019-09-05 ENCOUNTER — Other Ambulatory Visit: Payer: Self-pay

## 2019-09-05 ENCOUNTER — Ambulatory Visit (INDEPENDENT_AMBULATORY_CARE_PROVIDER_SITE_OTHER): Payer: BC Managed Care – PPO

## 2019-09-05 ENCOUNTER — Ambulatory Visit (HOSPITAL_COMMUNITY)
Admission: EM | Admit: 2019-09-05 | Discharge: 2019-09-05 | Disposition: A | Discharge status: Home / Self Care | Payer: BC Managed Care – PPO | Attending: Family Medicine | Admitting: Family Medicine

## 2019-09-05 ENCOUNTER — Telehealth: Payer: Self-pay

## 2019-09-05 ENCOUNTER — Encounter (HOSPITAL_COMMUNITY): Payer: Self-pay | Admitting: Orthopedic Surgery

## 2019-09-05 DIAGNOSIS — S91332A Puncture wound without foreign body, left foot, initial encounter: Secondary | ICD-10-CM

## 2019-09-05 DIAGNOSIS — I10 Essential (primary) hypertension: Secondary | ICD-10-CM

## 2019-09-05 DIAGNOSIS — Z23 Encounter for immunization: Secondary | ICD-10-CM

## 2019-09-05 DIAGNOSIS — L03115 Cellulitis of right lower limb: Secondary | ICD-10-CM

## 2019-09-05 MED ORDER — CEFTRIAXONE SODIUM 1 G IJ SOLR
1.0000 g | Freq: Once | INTRAMUSCULAR | Status: AC
  Administered 2019-09-05: 1 g via INTRAMUSCULAR

## 2019-09-05 MED ORDER — DOXYCYCLINE HYCLATE 100 MG PO CAPS: 100.0000 mg | ORAL_CAPSULE | Freq: Two times a day (BID) | ORAL | 0 refills | Status: DC

## 2019-09-05 MED ORDER — TETANUS-DIPHTH-ACELL PERTUSSIS 5-2.5-18.5 LF-MCG/0.5 IM SUSP
INTRAMUSCULAR | Status: AC
  Filled 2019-09-05: qty 0.5

## 2019-09-05 MED ORDER — CEFTRIAXONE SODIUM 1 G IJ SOLR
INTRAMUSCULAR | Status: AC
  Filled 2019-09-05: qty 10

## 2019-09-05 MED ORDER — TETANUS-DIPHTH-ACELL PERTUSSIS 5-2.5-18.5 LF-MCG/0.5 IM SUSP
0.5000 mL | Freq: Once | INTRAMUSCULAR | Status: AC
  Administered 2019-09-05: 0.5 mL via INTRAMUSCULAR

## 2019-09-05 MED ORDER — LIDOCAINE HCL (PF) 1 % IJ SOLN
INTRAMUSCULAR | Status: AC
  Filled 2019-09-05: qty 2

## 2019-09-05 NOTE — Telephone Encounter (Signed)
[place] Night - Client Nonclinical Telephone Record [facility] Client [facility] Night - Client Client Site [facility] Physician Renford Dills - MD Contact Type Call Who Is Calling Patient / Member / Family / Caregiver Caller Name [place] Phone Number [phone] Patient Name Miranda Willis Patient DOB Feb 02, 1956 Call Type Message Only Information Provided Reason for Call Request to Schedule Office Appointment Initial Comment Caller states she is needing to schedule an appt for today if possible, stepped on a nail, no better, sore, painful, and asks for a call back. Refused triage, already spoke with triage. Additional Comment Provided information for a call back from the office. Disp. Time Disposition Final User 09/05/2019 8:09:56 AM General Information Provided Yes Rosana Fret Call Closed By: Rosana Fret Transaction Date/Time: 09/05/2019 8:07:57 AM (ET)

## 2019-09-05 NOTE — Telephone Encounter (Signed)
[place] Night - Client TELEPHONE ADVICE RECORD [facility]urse Patient Name: Miranda Willis Gender: Female DOB: [birth year] Age: 64 Y 63 M 26 D Return Phone Number: [phone] (Primary) [place] [address] Client [place] Night - Client Client Site [facility] Physician Miranda Willis - MD Contact Type Call Who Is Calling Patient / Member / Family / Caregiver Call Type Triage / Clinical Relationship To Patient Self Return Phone Number [phone] (Primary) Chief Complaint Puncture Wound / Stab Wound Reason for Call Request to Schedule Office Appointment Initial Comment Caller states she stepped on a rusty nail Saturday afternoon - her foot is painful and swollen. Translation No Nurse Assessment Nurse: Miranda Resides, RN, Miranda Willis Date/Time Miranda Willis Time): 09/05/2019 7:01:57 AM Confirm and document reason for call. If symptomatic, describe symptoms. ---Caller states she stepped on a rusty nail Saturday afternoon - her foot is painful and swollen where the nail was and her toes. Aches and throbs. Hurts to walk. Pt. has been soaking in epsom salt and applying abx ointment. No fever. Foot is also warm to touch. Has the patient had close contact with a person known or suspected to have the novel coronavirus illness OR traveled / lives in area with major community spread (including international travel) in the last 14 days from the onset of symptoms? * If Asymptomatic, screen for exposure and travel within the last 14 days. ---No Does the patient have any new or worsening symptoms? ---Yes Will a triage be completed? ---Yes Related visit to physician within the last 2 weeks? ---No Does the PT have any chronic conditions? (i.e. diabetes, asthma, this includes High risk factors for pregnancy, etc.) ---Yes List chronic conditions. ---HTN Is this a behavioral health or substance abuse call?  ---No Guidelines Guideline Title Affirmed Question Affirmed Notes Nurse Date/Time Miranda Willis Time) Puncture Wound [1] SEVERE pain AND [2] not improved 2 hours after pain medicine Miranda Resides, RN, Miranda Willis 09/05/2019 7:04:20 AM Disp. Time Miranda Willis Time) Disposition Final User PLEASE NOTE: All timestamps contained within this report are represented as [place] Standard Time. CONFIDENTIALTY NOTICE: This fax transmission is intended only for the addressee. It contains information that is legally privileged, confidential or otherwise protected from use or disclosure. If you are not the intended recipient, you are strictly prohibited from reviewing, disclosing, copying using or disseminating any of this information or taking any action in reliance on or regarding this information. If you have received this fax in error, please notify us immediately by telephone so that we can arrange for its return to [place]. Phone: [phone], Toll-Free: [phone], Fax: [phone] Page: 2 of 2 Call Id: [id number] 09/05/2019 7:05:47 AM See HCP within 4 Hours (or PCP triage) Yes Miranda Resides, RN, Miranda Willis Caller Disagree/Comply Comply Caller Understands Yes PreDisposition Call Doctor Care Advice Given Per Guideline SEE HCP WITHIN 4 HOURS (OR PCP TRIAGE): * IF OFFICE WILL BE OPEN: You need to be seen within the next 3 or 4 hours. Call your doctor (or NP/PA) now or as soon as the office opens. [place] THE WOUND: * Wash dirty or clean puncture wounds with warm water and soap for 15 minutes. * For any dirt or debris, scrub the wound surface back and forth with a washcloth to remove it. DRESSING THE WOUND: * Cover the wound with a dressing. PAIN MEDICINES: * IBUPROFEN (E.G., MOTRIN, ADVIL): Take 400 mg (two 200 mg pills) by mouth every 6 hours. The most you should take each day is  1,200 mg (six 200 mg pills), unless your doctor has told you to take more. CALL BACK IF: * You become worse. CARE ADVICE given per Puncture Wound (Adult)  guideline. Comments User: Hildred Priest, RN Date/Time Miranda Willis Time): 09/05/2019 7:06:18 AM Pt. will call the office after 8 for appt. and go to UC if no appt. available. Referrals REFERRED TO PCP OFFICE

## 2019-09-05 NOTE — ED Triage Notes (Signed)
Pt stepped on a nail on right foot on Saturday (Between 3rd and 4th toe). Soaked in [employer] salt and applied  Neosporin. Pt took Ibuprofen 800 mg @ 0600 . Pt reports rusty nail, she did pull it all out.   Pt reports last Tetanus vaccine 2014  Right foot appears swollen.

## 2019-09-05 NOTE — Discharge Instructions (Addendum)
Meds ordered this encounter  Medications   Tdap (BOOSTRIX) injection 0.5 mL   cefTRIAXone (ROCEPHIN) injection 1 g   doxycycline (VIBRAMYCIN) 100 MG capsule    Sig: Take 1 capsule (100 mg total) by mouth 2 (two) times daily.    Dispense:  20 capsule    Refill:  0    Your blood pressure was noted to be slightly elevated during your visit today. If you are currently taking medication for high blood pressure, please ensure you are taking this as directed. If you do not have a history of high blood pressure and your blood pressure remains persistently elevated, you may need to begin taking a medication at some point. You may return here within the next few days to recheck if unable to see your primary care provider or if do not have a one.  BP (!) 156/58 (BP Location: Left Arm)   Pulse 77   Temp 98.4 F (36.9 C) (Oral)   Resp 16   SpO2 99%

## 2019-09-05 NOTE — ED Provider Notes (Signed)
[place]   [id number] 09/05/19 Arrival Time: 0940  ASSESSMENT & PLAN:  1. Puncture wound of left foot, initial encounter   2. Cellulitis of right lower extremity     I have personally viewed the imaging studies ordered this visit. No foreign body appreciated.  Given: Meds ordered this encounter  Medications  . Tdap (BOOSTRIX) injection 0.5 mL  . cefTRIAXone (ROCEPHIN) injection 1 g   To begin: Meds ordered this encounter  Medications  . doxycycline (VIBRAMYCIN) 100 MG capsule    Sig: Take 1 capsule (100 mg total) by mouth 2 (two) times daily.    Dispense:  20 capsule    Refill:  0    Recommend: Follow-up Information    [facility]. Schedule an appointment as soon as possible for a visit in 2 days.   Contact information: [facility]) Address: [address] [place], [place], [place] [address] Phone: [phone]          Reviewed expectations re: course of current medical issues. Questions answered. Outlined signs and symptoms indicating need for more acute intervention. Patient verbalized understanding. After Visit Summary given.  SUBJECTIVE: History from: patient. Miranda Willis is a 64 y.o. female who reports stepping on two nails approx 48 hours ago; punctured flip flop into plantar foot. Gradual onset of swelling, redness, and pain. Afebrile. No drainage. Wounds were cleaned at home after injury. Ambulatory but with reported left foot pain. No drainage or bleeding from puncture sites. Extremity sensation changes or weakness: none. Self treatment: has not tried OTC therapies.  History of similar: no. Reports Td in 2014.  Past Surgical History:  Procedure Laterality Date  . ABDOMINAL HYSTERECTOMY    . ANTERIOR AND POSTERIOR REPAIR N/A 03/03/2019   Procedure: ANTERIOR (CYSTOCELE) AND POSTERIOR REPAIR (RECTOCELE);  Surgeon: Paula Compton, MD;  Location[facility];  Service: Gynecology;   Laterality: N/A;  . BACK SURGERY  1997  . CYSTOSCOPY N/A 03/03/2019   Procedure: CYSTOSCOPY;  Surgeon: Paula Compton, MD;  Location: [facility];  Service: Gynecology;  Laterality: N/A;  . CYSTOSCOPY WITH URETEROSCOPY AND STENT PLACEMENT Left 03/03/2019   Procedure: CYSTOSCOPY WITH URETEROSCOPY AND STENT PLACEMENT;  Surgeon: Paula Compton, MD;  Location: [facility];  Service: Gynecology;  Laterality: Left;  . CYSTOSCOPY WITH URETEROSCOPY AND STENT PLACEMENT Left 03/03/2019   Procedure: CYSTOSCOPY WITH URETEROSCOPY AND STENT PLACEMENT;  Surgeon: Cleon Gustin, MD;  Location: [facility];  Service: Urology;  Laterality: Left;  . DILATION AND CURETTAGE OF UTERUS  1985   due to miscarriage  . EYE SURGERY Bilateral 2016  . NOSE SURGERY    . SPINE SURGERY  06/1996   back surgery L5-S1 Dr Jenean Lindau  . VAGINAL HYSTERECTOMY N/A 03/03/2019   Procedure: HYSTERECTOMY VAGINAL;  Surgeon: Paula Compton, MD;  Location: [facility];  Service: Gynecology;  Laterality: N/A;      OBJECTIVE:  Vitals:   09/05/19 1033  BP: (!) 156/58  Pulse: 77  Resp: 16  Temp: 98.4 F (36.9 C)  TempSrc: Oral  SpO2: 99%    General appearance: alert; no distress HEENT: [place]; AT Neck: supple with FROM Resp: unlabored respirations Extremities: . RLE: warm with well perfused appearance; poorly localized moderate tenderness over right plantar foot with erythema of skin of distal plantar foot; two puncture wounds identified without bleeding or drainage; no areas of fluctuance CV: brisk extremity capillary refill of RLE; 2+ DP  pulse of RLE. Skin: warm and dry; no visible rashes Neurologic: gait normal but favors right foot; normal sensation and strength of RLE Psychological: alert and cooperative; normal mood and affect  Imaging: DG Foot Complete Right  Result Date: 09/05/2019 CLINICAL DATA:  Puncture wound EXAM: RIGHT FOOT COMPLETE - 3+ VIEW  COMPARISON:  None. FINDINGS: Plantar calcaneal spur. No acute bony abnormality. Specifically, no fracture, subluxation, or dislocation. No radiopaque foreign bodies. IMPRESSION: Calcaneal spur.  No acute bony abnormality. Electronically Signed   By: Rolm Baptise M.D.   On: 09/05/2019 11:38      No Known Allergies  Past Medical History:  Diagnosis Date  . Complication of anesthesia   . Hypertension   . Osteoarthritis   . PONV (postoperative nausea and vomiting)    Social History   Socioeconomic History  . Marital status: Married    Spouse name: Not on file  . Number of children: Not on file  . Years of education: Not on file  . Highest education level: Not on file  Occupational History  . Not on file  Tobacco Use  . Smoking status: Never Smoker  . Smokeless tobacco: Never Used  Substance and Sexual Activity  . Alcohol use: No    Alcohol/week: 0.0 standard drinks  . Drug use: No  . Sexual activity: Not on file  Other Topics Concern  . 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 Difficulty of Paying Living Expenses:   Food Insecurity:   . Worried About Charity fundraiser in the Last Year:   . Arboriculturist in the Last Year:   Transportation Needs:   . Film/video editor (Medical):   Marland Kitchen Lack of Transportation (Non-Medical):   Physical Activity:   . Days of Exercise per Week:   . Minutes of Exercise per Session:   Stress:   . Feeling of Stress :   Social Connections:   . Frequency of Communication with Friends and Family:   . Frequency of Social Gatherings with Friends and Family:   . Attends Religious Services:   . Active Member of Clubs or Organizations:   . Attends Archivist Meetings:   Marland Kitchen Marital Status:    Family History  Problem Relation Age of Onset  . Heart disease Mother        MI  . Depression  Mother   . Heart disease Father        MI  . Depression Father   . Heart disease Maternal Grandmother        MI and CAD  . Depression Paternal Grandfather   . Colon cancer Neg Hx   . Breast cancer Neg Hx    Past Surgical History:  Procedure Laterality Date  . ABDOMINAL HYSTERECTOMY    . ANTERIOR AND POSTERIOR REPAIR N/A 03/03/2019   Procedure: ANTERIOR (CYSTOCELE) AND POSTERIOR REPAIR (RECTOCELE);  Surgeon: Paula Compton, MD;  Location: [facility];  Service: Gynecology;  Laterality: N/A;  . BACK SURGERY  1997  . CYSTOSCOPY N/A 03/03/2019   Procedure: CYSTOSCOPY;  Surgeon: Paula Compton, MD;  Location: [facility];  Service: Gynecology;  Laterality: N/A;  . CYSTOSCOPY WITH URETEROSCOPY AND STENT PLACEMENT Left 03/03/2019   Procedure: CYSTOSCOPY WITH URETEROSCOPY AND STENT PLACEMENT;  Surgeon: Paula Compton, MD;  Location: [place];  Service: Gynecology;  Laterality: Left;  . CYSTOSCOPY WITH URETEROSCOPY AND STENT PLACEMENT Left 03/03/2019   Procedure: CYSTOSCOPY WITH URETEROSCOPY AND STENT PLACEMENT;  Surgeon: Cleon Gustin, MD;  Location: [facility];  Service: Urology;  Laterality: Left;  . DILATION AND CURETTAGE OF UTERUS  1985   due to miscarriage  . EYE SURGERY Bilateral 2016  . NOSE SURGERY    . SPINE SURGERY  06/1996   back surgery L5-S1 Dr Jenean Lindau  . VAGINAL HYSTERECTOMY N/A 03/03/2019   Procedure: HYSTERECTOMY VAGINAL;  Surgeon: Paula Compton, MD;  Location: [facility];  Service: Gynecology;  Laterality: N/AVanessa Kick, MD 09/07/19 [phone]

## 2019-09-05 NOTE — Telephone Encounter (Signed)
Already spoken with pt this morning and pt is going to [facility] on [address] this morning.

## 2019-09-05 NOTE — Telephone Encounter (Signed)
I spoke with pt and she has already spoken with someone at [facility] and advised to go to UC. No availalble appts.Pt last tdap 2014 and advised pt will need to get tetanus injection also. Pt voiced understanding and is going to UC on [address].

## 2019-09-06 NOTE — Telephone Encounter (Signed)
Please get update on patient.  Thanks. 

## 2019-09-08 ENCOUNTER — Ambulatory Visit: Payer: BC Managed Care – PPO | Admitting: Podiatry

## 2019-09-08 ENCOUNTER — Other Ambulatory Visit: Payer: Self-pay

## 2019-09-08 ENCOUNTER — Ambulatory Visit (INDEPENDENT_AMBULATORY_CARE_PROVIDER_SITE_OTHER): Payer: BC Managed Care – PPO

## 2019-09-08 DIAGNOSIS — L03119 Cellulitis of unspecified part of limb: Secondary | ICD-10-CM | POA: Diagnosis not present

## 2019-09-08 DIAGNOSIS — S91331A Puncture wound without foreign body, right foot, initial encounter: Secondary | ICD-10-CM

## 2019-09-08 DIAGNOSIS — L02619 Cutaneous abscess of unspecified foot: Secondary | ICD-10-CM

## 2019-09-08 MED ORDER — CIPROFLOXACIN HCL 500 MG PO TABS: 500.0000 mg | ORAL_TABLET | Freq: Two times a day (BID) | ORAL | 0 refills | Status: DC

## 2019-09-08 MED ORDER — HYDROCODONE-ACETAMINOPHEN 10-325 MG PO TABS: 1.0000 | ORAL_TABLET | ORAL | 0 refills | Status: AC | PRN

## 2019-09-08 NOTE — Telephone Encounter (Signed)
Message left for patient to return my call.  

## 2019-09-09 NOTE — Progress Notes (Signed)
Subjective:   Patient ID: Miranda Willis, female   DOB: 64 y.o.   MRN: [id number]   HPI Patient presents stating that she stepped on 2 nails and she has had some swelling in her foot.  States it happened approximately 5 days ago and while her foot seems to be some better with an antibiotic she got at the urgent care it still sore.  Patient did go through a flip-flop with this and did have a small amount of bleeding but does not think any portion of foreign body was left in the area   Review of Systems  All other systems reviewed and are negative.       Objective:  Physical Exam Vitals and nursing note reviewed.  Constitutional:      Appearance: She is well-developed.  Pulmonary:     Effort: Pulmonary effort is normal.  Musculoskeletal:        General: Normal range of motion.  Skin:    General: Skin is warm.  Neurological:     Mental Status: She is alert.     Neurovascular status found to be intact muscle strength was adequate with patient noted to have mild swelling around the distal puncture site extending to the midfoot with no increased erythema or any drainage noted.  Patient has no systemic signs of infection no warmth in the lower leg was noted.  The area around the distal puncture site is mildly swollen but I did not note any drainage in the plantar puncture and the more proximal what appears to be normal with no indications of any issue     Assessment:  Possibility for seeding of mixed type of bacteria secondary to puncture injury with patient already receiving her tetanus injection and currently on doxycycline     Plan:  H&P x-rays reviewed condition discussed.  At this time I did up nerve block of the PT nerve in order to get as much numbness as possible.  I then utilized a 15 blade and made a small incision of about 5 mm centered over the puncture site.  I did open this over and probed the area and I did not note any indication of abscess.  I then went ahead and I  placed a deep swab into the area for culture and sensitivity I flushed it and applied sterile dressing and dispensed a surgical shoe with a wedge.  Also placed her on Cipro 500 mg twice daily instructed on elevation and the beginnings of soaks and I want to check her back again in 6 days.  I gave strict instructions of any increased erythema edema or any drainage were to occur or if patient were to develop any signs of systemic infection she is to go directly to the emergency room for probable IV antibiotics and I educated her on this.  I am hopeful this will respond to oral antibiotics but will need to be watched  X-rays currently were negative for signs of any form of osteolysis or indications of soft tissue bubbling currently

## 2019-09-09 NOTE — Telephone Encounter (Signed)
Pt states that she was seen by [facility] -- they opened up the wound and collected a culture.  Pt is on two different abx to treat possible infection.Pt states that she is doing okay, not in any pain, limited on her movement. Pt will keep [place] updated if any increase in symptoms.

## 2019-09-09 NOTE — Telephone Encounter (Signed)
Noted. Thanks.

## 2019-09-12 LAB — WOUND CULTURE
MICRO NUMBER:: 10532358
RESULT:: NO GROWTH
SPECIMEN QUALITY:: ADEQUATE

## 2019-09-15 ENCOUNTER — Telehealth: Payer: Self-pay | Admitting: *Deleted

## 2019-09-15 ENCOUNTER — Other Ambulatory Visit: Payer: Self-pay

## 2019-09-15 ENCOUNTER — Encounter: Payer: Self-pay | Admitting: Podiatry

## 2019-09-15 ENCOUNTER — Ambulatory Visit: Payer: BC Managed Care – PPO | Admitting: Podiatry

## 2019-09-15 VITALS — Temp 97.8°F

## 2019-09-15 DIAGNOSIS — L02619 Cutaneous abscess of unspecified foot: Secondary | ICD-10-CM

## 2019-09-15 DIAGNOSIS — S91331A Puncture wound without foreign body, right foot, initial encounter: Secondary | ICD-10-CM

## 2019-09-15 DIAGNOSIS — L03119 Cellulitis of unspecified part of limb: Secondary | ICD-10-CM

## 2019-09-15 MED ORDER — DOXYCYCLINE HYCLATE 100 MG PO TABS: 100.0000 mg | ORAL_TABLET | Freq: Two times a day (BID) | ORAL | 0 refills | Status: DC

## 2019-09-15 MED ORDER — TRAMADOL HCL 50 MG PO TABS: 50.0000 mg | ORAL_TABLET | Freq: Three times a day (TID) | ORAL | 2 refills | Status: DC

## 2019-09-15 NOTE — Progress Notes (Signed)
Subjective:   Patient ID: Miranda Willis, female   DOB: 64 y.o.   MRN: [id number]   HPI Patient states her foot is still swollen and sore and while the warmth seems to have reduced she still has a lot of pain with it.  She states she has not run a fever and she is not had any proximal erythema edema or other pathology.   ROS      Objective:  Physical Exam  Neurovascular status intact with plantar puncture from having had a nail edge of her foot approximately 10 days ago to rubber is healed and crusted over but there continues to be swelling plantarly dorsal into the midfoot with mild warmth noted with no active drainage and negative culture up to this point     Assessment:  Probability for some form of subtle infection or possible abscess right foot secondary to puncture wound     Plan:  H&P reviewed condition.  Reviewed condition and had Dr. Carman Ching see patient also were both in agreement that MRI with contrast to try to see whether or not there is any abscess formation is necessary and that most likely this is can require a deeper opening of the tissue and patient does not want it done here and we are taking her to the operating room next week once we get the results of the MRI.  I want her to continue doxycycline Cipro and I also went ahead today placed her on tramadol to help with the pain she is experiencing continue nonweightbearing and I am ordering CBC with differential sed rate and complete metabolic panel to rule out other pathologies.  Educated her if any increasing redness swelling systemic signs of infection she is to go straight to the emergency room for IV antibiotics

## 2019-09-15 NOTE — Telephone Encounter (Signed)
I spoke with Miranda Willis and pt is scheduled 09/17/2019 at 12:00pm arrive at 11:30am. Faxed orders with PA to [facility].

## 2019-09-15 NOTE — Patient Instructions (Signed)
Pre-Operative Instructions  Congratulations, you have decided to take an important step towards improving your quality of life.  You can be assured that the doctors and staff at [facility] will be with you every step of the way.  Here are some important things you should know:  1. Plan to be at the surgery center/hospital at least 1 (one) hour prior to your scheduled time, unless otherwise directed by the surgical center/hospital staff.  You must have a responsible adult accompany you, remain during the surgery and drive you home.  Make sure you have directions to the surgical center/hospital to ensure you arrive on time. 2. If you are having surgery at [facility] or [facility] hospitals, you will need a copy of your medical history and physical form from your family physician within one month prior to the date of surgery. We will give you a form for your primary physician to complete.  3. We make every effort to accommodate the date you request for surgery.  However, there are times where surgery dates or times have to be moved.  We will contact you as soon as possible if a change in schedule is required.   4. No aspirin/ibuprofen for one week before surgery.  If you are on aspirin, any non-steroidal anti-inflammatory medications (Mobic, Aleve, Ibuprofen) should not be taken seven (7) days prior to your surgery.  You make take Tylenol for pain prior to surgery.  5. Medications - If you are taking daily heart and blood pressure medications, seizure, reflux, allergy, asthma, anxiety, pain or diabetes medications, make sure you notify the surgery center/hospital before the day of surgery so they can tell you which medications you should take or avoid the day of surgery. 6. No food or drink after midnight the night before surgery unless directed otherwise by surgical center/hospital staff. 7. No alcoholic beverages 24-hours prior to surgery.  No smoking 24-hours prior or 24-hours after  surgery. 8. Wear loose pants or shorts. They should be loose enough to fit over bandages, boots, and casts. 9. Don't wear slip-on shoes. Sneakers are preferred. 10. Bring your boot with you to the surgery center/hospital.  Also bring crutches or a walker if your physician has prescribed it for you.  If you do not have this equipment, it will be provided for you after surgery. 11. If you have not been contacted by the surgery center/hospital by the day before your surgery, call to confirm the date and time of your surgery. 12. Leave-time from work may vary depending on the type of surgery you have.  Appropriate arrangements should be made prior to surgery with your employer. 13. Prescriptions will be provided immediately following surgery by your doctor.  Fill these as soon as possible after surgery and take the medication as directed. Pain medications will not be refilled on weekends and must be approved by the doctor. 14. Remove nail polish on the operative foot and avoid getting pedicures prior to surgery. 15. Wash the night before surgery.  The night before surgery wash the foot and leg well with water and the antibacterial soap provided. Be sure to pay special attention to beneath the toenails and in between the toes.  Wash for at least three (3) minutes. Rinse thoroughly with water and dry well with a towel.  Perform this wash unless told not to do so by your physician.  Enclosed: 1 Ice pack (please put in freezer the night before surgery)   1 Hibiclens skin cleaner     Pre-op instructions  If you have any questions regarding the instructions, please do not hesitate to call our office.  [place]: [address], [place], [place] [address] -- [phone]  [place]: [address] [place], [place] [address] -- [phone]  [place]: [address], , [place] [address] -- [phone]   Website: [email or web address] 

## 2019-09-15 NOTE — Telephone Encounter (Signed)
Dr. Paulla Dolly ordered right foot MRI with and without contrast for abscess.

## 2019-09-15 NOTE — Telephone Encounter (Signed)
I called [employer] [place] [employer] [place] states criteria is met for coverage of right foot MRI with and without contrast 73720 for abscess, PA:  [id number], valid 09/15/2019 - 03/12/2020. Left message [facility] for a call back to schedule. 

## 2019-09-16 ENCOUNTER — Ambulatory Visit: Payer: Self-pay | Admitting: Podiatry

## 2019-09-16 LAB — SEDIMENTATION RATE: Sed Rate: 25 mm/h (ref 0–30)

## 2019-09-16 LAB — COMPREHENSIVE METABOLIC PANEL
AG Ratio: 1.5 (calc) (ref 1.0–2.5)
ALT: 14 U/L (ref 6–29)
AST: 14 U/L (ref 10–35)
Albumin: 4 g/dL (ref 3.6–5.1)
Alkaline phosphatase (APISO): 60 U/L (ref 37–153)
BUN: 17 mg/dL (ref 7–25)
CO2: 28 mmol/L (ref 20–32)
Calcium: 9.4 mg/dL (ref 8.6–10.4)
Chloride: 105 mmol/L (ref 98–110)
Creat: 0.86 mg/dL (ref 0.50–0.99)
Globulin: 2.6 g/dL (calc) (ref 1.9–3.7)
Glucose, Bld: 97 mg/dL (ref 65–99)
Potassium: 4.8 mmol/L (ref 3.5–5.3)
Sodium: 140 mmol/L (ref 135–146)
Total Bilirubin: 0.2 mg/dL (ref 0.2–1.2)
Total Protein: 6.6 g/dL (ref 6.1–8.1)

## 2019-09-16 LAB — CBC WITH DIFFERENTIAL/PLATELET
Absolute Monocytes: 577 cells/uL (ref 200–950)
Basophils Absolute: 73 cells/uL (ref 0–200)
Basophils Relative: 1 %
Eosinophils Absolute: 256 cells/uL (ref 15–500)
Eosinophils Relative: 3.5 %
HCT: 36.6 % (ref 35.0–45.0)
Hemoglobin: 12 g/dL (ref 11.7–15.5)
Lymphs Abs: 1591 cells/uL (ref 850–3900)
MCH: 27.6 pg (ref 27.0–33.0)
MCHC: 32.8 g/dL (ref 32.0–36.0)
MCV: 84.1 fL (ref 80.0–100.0)
MPV: 10.1 fL (ref 7.5–12.5)
Monocytes Relative: 7.9 %
Neutro Abs: 4803 cells/uL (ref 1500–7800)
Neutrophils Relative %: 65.8 %
Platelets: 312 10*3/uL (ref 140–400)
RBC: 4.35 10*6/uL (ref 3.80–5.10)
RDW: 12.6 % (ref 11.0–15.0)
Total Lymphocyte: 21.8 %
WBC: 7.3 10*3/uL (ref 3.8–10.8)

## 2019-09-17 ENCOUNTER — Other Ambulatory Visit: Payer: Self-pay

## 2019-09-17 ENCOUNTER — Ambulatory Visit (HOSPITAL_BASED_OUTPATIENT_CLINIC_OR_DEPARTMENT_OTHER)
Admission: RE | Admit: 2019-09-17 | Discharge: 2019-09-17 | Disposition: A | Discharge status: Home / Self Care | Payer: BC Managed Care – PPO | Source: Ambulatory Visit | Attending: Podiatry | Admitting: Podiatry

## 2019-09-17 DIAGNOSIS — L03119 Cellulitis of unspecified part of limb: Secondary | ICD-10-CM | POA: Insufficient documentation

## 2019-09-17 DIAGNOSIS — S91331A Puncture wound without foreign body, right foot, initial encounter: Secondary | ICD-10-CM | POA: Insufficient documentation

## 2019-09-17 DIAGNOSIS — L02619 Cutaneous abscess of unspecified foot: Secondary | ICD-10-CM | POA: Diagnosis present

## 2019-09-17 MED ORDER — GADOBUTROL 1 MMOL/ML IV SOLN
7.5000 mL | Freq: Once | INTRAVENOUS | Status: AC | PRN
  Administered 2019-09-17: 7.5 mL via INTRAVENOUS

## 2019-09-18 ENCOUNTER — Other Ambulatory Visit: Payer: Self-pay | Admitting: Family Medicine

## 2019-09-18 ENCOUNTER — Other Ambulatory Visit: Payer: Self-pay | Admitting: Podiatry

## 2019-09-19 NOTE — Telephone Encounter (Addendum)
Electronic refill request. Triamcinolone Cream Last office visit:   01/18/2019 Last Filled:   30 g  On 11/01/2018 The original prescription was discontinued on 03/04/2019 by Paula Compton, MD for the following reason: Stop Taking at Discharge. Renewing this prescription may not be appropriate.

## 2019-09-20 ENCOUNTER — Encounter: Payer: Self-pay | Admitting: Podiatry

## 2019-09-20 DIAGNOSIS — L02611 Cutaneous abscess of right foot: Secondary | ICD-10-CM

## 2019-09-20 NOTE — Telephone Encounter (Signed)
Please get update on patient and this refill request.  I would not expect her to be using this on her foot given her recent injury.  Thanks.

## 2019-09-20 NOTE — Telephone Encounter (Signed)
Spoke with patient's husband.  She is currently in surgery.  Will deny refill for now.  He will have patient call the office back if she did request the refill.

## 2019-09-23 ENCOUNTER — Telehealth: Payer: Self-pay | Admitting: Podiatry

## 2019-09-23 NOTE — Telephone Encounter (Signed)
Pt called and wanted to get some instructions on how to care for surgical foot

## 2019-09-23 NOTE — Telephone Encounter (Signed)
If she feels good I would leave the dressing on until we see her next week

## 2019-09-26 ENCOUNTER — Other Ambulatory Visit: Payer: Self-pay

## 2019-09-26 ENCOUNTER — Encounter: Payer: Self-pay | Admitting: Podiatry

## 2019-09-26 ENCOUNTER — Ambulatory Visit (INDEPENDENT_AMBULATORY_CARE_PROVIDER_SITE_OTHER): Payer: BC Managed Care – PPO

## 2019-09-26 ENCOUNTER — Ambulatory Visit (INDEPENDENT_AMBULATORY_CARE_PROVIDER_SITE_OTHER): Payer: BC Managed Care – PPO | Admitting: Podiatry

## 2019-09-26 VITALS — Temp 97.1°F

## 2019-09-26 DIAGNOSIS — L03119 Cellulitis of unspecified part of limb: Secondary | ICD-10-CM | POA: Diagnosis not present

## 2019-09-26 DIAGNOSIS — L02619 Cutaneous abscess of unspecified foot: Secondary | ICD-10-CM

## 2019-09-26 MED ORDER — CIPROFLOXACIN HCL 500 MG PO TABS: 500.0000 mg | ORAL_TABLET | Freq: Two times a day (BID) | ORAL | 0 refills | Status: DC

## 2019-09-26 MED ORDER — DOXYCYCLINE HYCLATE 100 MG PO TABS: 100.0000 mg | ORAL_TABLET | Freq: Two times a day (BID) | ORAL | 0 refills | Status: DC

## 2019-09-26 NOTE — Progress Notes (Signed)
Subjective:   Patient ID: Miranda Willis, female   DOB: 64 y.o.   MRN: [id number]   HPI Patient presents stating that she seems to be doing better but she still has some pain in her forefoot and she is still wearing the offloading shoe as best as possible.  States she has just run out her antibiotic   ROS      Objective:  Physical Exam  Neurovascular status intact patient has negative Bevelyn Buckles' sign does not have warmth in her foot currently and has no systemic signs of infection with normal temperature.  Patient has incision bottom right foot which appears to be healing well with no drainage which occurred no odor or other pathology associated with it     Assessment:  Incision site plantar right secondary to deep I&D of the area that appears to be healing well with no current indications of infection with no proximal indications of infection     Plan:  Precautionary x-rays taken reviewed I advised on continued shoe that she was wearing previously and I went ahead and I went ahead and placed her on her doxycycline Cipro as precautionary measure for the next couple weeks due to the puncture wound and the fact it went through her shoe.  At this point everything looks good but I gave her strict instructions of any redness swelling or any systemic signs of infection were to occur she is to let us know immediately and it still is possible that there could be harboring bacteria  X-ray indicates that the bone looks good with no indications of osteolysis currently

## 2019-09-28 ENCOUNTER — Encounter: Payer: BC Managed Care – PPO | Admitting: Podiatry

## 2019-10-14 ENCOUNTER — Encounter: Payer: Self-pay | Admitting: Podiatry

## 2019-10-14 ENCOUNTER — Ambulatory Visit (INDEPENDENT_AMBULATORY_CARE_PROVIDER_SITE_OTHER): Payer: BC Managed Care – PPO | Admitting: Podiatry

## 2019-10-14 ENCOUNTER — Ambulatory Visit (INDEPENDENT_AMBULATORY_CARE_PROVIDER_SITE_OTHER): Payer: BC Managed Care – PPO

## 2019-10-14 ENCOUNTER — Other Ambulatory Visit: Payer: Self-pay

## 2019-10-14 DIAGNOSIS — S91331A Puncture wound without foreign body, right foot, initial encounter: Secondary | ICD-10-CM | POA: Diagnosis not present

## 2019-10-18 NOTE — Progress Notes (Signed)
Subjective:   Patient ID: Miranda Willis, female   DOB: 64 y.o.   MRN: [id number]   HPI Patient states that she seems to be improved has stopped antibiotics and so far is not having discomfort with stitches intact    ROS      Objective:  Physical Exam  Neurovascular status intact with patient's right foot improving with no erythema edema and no current drainage noted with stitches intact     Assessment:  Doing well post I&D abscess of the deep nature right at surgical center     Plan:  H&P reviewed condition recommended the continuation of conservative care stitches removed wound edges coapted well no drainage and strict instructions of any changes were to occur to reappoint immediately  X-rays were negative for signs of any bone changes or indications of osteolysis

## 2019-10-27 ENCOUNTER — Telehealth: Payer: Self-pay

## 2019-10-27 ENCOUNTER — Other Ambulatory Visit: Payer: Self-pay

## 2019-10-27 ENCOUNTER — Other Ambulatory Visit: Payer: Self-pay | Admitting: Family Medicine

## 2019-10-27 MED ORDER — LISINOPRIL 10 MG PO TABS: 10.0000 mg | ORAL_TABLET | Freq: Every day | ORAL | 1 refills | Status: DC

## 2019-10-27 NOTE — Telephone Encounter (Signed)
LVM that there is no msg in chart that anyone called from this clinic.

## 2019-10-27 NOTE — Telephone Encounter (Signed)
Sending note to [place] both pts are Dr Josefine Class pt. Not sure what call was in reference to.

## 2019-10-27 NOTE — Telephone Encounter (Signed)
[place] Night - Client Nonclinical Telephone Record AccessNurse Client [place] Night - Client Client Site [place] Physician Renford Dills - MD Contact Type Call Who Is Calling Patient / Member / Family / Caregiver Caller Name [place] Phone Number [phone] Call Type Message Only Information Provided Reason for Call Returning a Call from the Office Initial [place] states he missed a call from the office. Additional Comment his wife is Clare Fennimore DOB 07-27-1956 and his son Saralynn Langhorst DOB 02/10/1985. Provided office hours. He states if the office calls back to leave a message in regards to what the call was for. Disp. Time Disposition Final User 10/26/2019 5:28:23 PM General Information Provided Yes [place], Tillie Rung Call Closed By: Vicente Masson Transaction Date/Time: 10/26/2019 5:25:47 PM (ET)

## 2020-01-20 ENCOUNTER — Encounter (HOSPITAL_COMMUNITY): Payer: Self-pay | Admitting: *Deleted

## 2020-01-20 ENCOUNTER — Ambulatory Visit (INDEPENDENT_AMBULATORY_CARE_PROVIDER_SITE_OTHER): Payer: BC Managed Care – PPO

## 2020-01-20 ENCOUNTER — Ambulatory Visit (HOSPITAL_COMMUNITY)
Admission: EM | Admit: 2020-01-20 | Discharge: 2020-01-20 | Disposition: A | Discharge status: Home / Self Care | Payer: BC Managed Care – PPO | Attending: Family Medicine | Admitting: Family Medicine

## 2020-01-20 ENCOUNTER — Other Ambulatory Visit: Payer: Self-pay

## 2020-01-20 DIAGNOSIS — M79672 Pain in left foot: Secondary | ICD-10-CM

## 2020-01-20 DIAGNOSIS — S93602A Unspecified sprain of left foot, initial encounter: Secondary | ICD-10-CM

## 2020-01-20 MED ORDER — PREDNISONE 10 MG (21) PO TBPK: ORAL_TABLET | Freq: Every day | ORAL | 0 refills | Status: AC

## 2020-01-20 NOTE — Discharge Instructions (Addendum)
Your xray was negative today for any fractures or misalignments.  Take ibuprofen as needed.  Rest and elevate your foot.  Apply ice packs 2-3 times a day for up to 20 minutes each.    I have sent in a prednisone taper for you to take for 6 days. 6 tablets on day one, 5 tablets on day two, 4 tablets on day three, 3 tablets on day four, 2 tablets on day five, and 1 tablet on day six.  Follow up with your primary care provider or an orthopedist if you symptoms continue or worsen;  Or if you develop new symptoms, such as numbness, tingling, or weakness.

## 2020-01-20 NOTE — ED Triage Notes (Signed)
Pt reports yesterday she was up and down a step stool painting and today her Lt foot hurts.

## 2020-01-20 NOTE — ED Provider Notes (Signed)
[place]   [id number] 01/20/20 Arrival Time: 9371  IR:CVELF PAIN  SUBJECTIVE: History from: patient. Miranda Willis is a 64 y.o. female complains of left foot pain since yesterday.  Reports yesterday she was climbing up and down a ladder, painting the interior of her house.  Reports that the pain is on the lateral aspect of the left foot.  Has not taken OTC medications for this. Symptoms are made worse with activity.  Denies similar symptoms in the past.  Denies fever, chills, erythema, ecchymosis, effusion, weakness, numbness and tingling, saddle paresthesias, loss of bowel or bladder function.      ROS: As per HPI.  All other pertinent ROS negative.     Past Medical History:  Diagnosis Date  . Complication of anesthesia   . Hypertension   . Osteoarthritis   . PONV (postoperative nausea and vomiting)    Past Surgical History:  Procedure Laterality Date  . ABDOMINAL HYSTERECTOMY    . ANTERIOR AND POSTERIOR REPAIR N/A 03/03/2019   Procedure: ANTERIOR (CYSTOCELE) AND POSTERIOR REPAIR (RECTOCELE);  Surgeon: Paula Compton, MD;  Location: [facility];  Service: Gynecology;  Laterality: N/A;  . BACK SURGERY  1997  . CYSTOSCOPY N/A 03/03/2019   Procedure: CYSTOSCOPY;  Surgeon: Paula Compton, MD;  Location: [facility];  Service: Gynecology;  Laterality: N/A;  . CYSTOSCOPY WITH URETEROSCOPY AND STENT PLACEMENT Left 03/03/2019   Procedure: CYSTOSCOPY WITH URETEROSCOPY AND STENT PLACEMENT;  Surgeon: Paula Compton, MD;  Location: [facility];  Service: Gynecology;  Laterality: Left;  . CYSTOSCOPY WITH URETEROSCOPY AND STENT PLACEMENT Left 03/03/2019   Procedure: CYSTOSCOPY WITH URETEROSCOPY AND STENT PLACEMENT;  Surgeon: Cleon Gustin, MD;  Location: [facility];  Service: Urology;  Laterality: Left;  . DILATION AND CURETTAGE OF UTERUS  1985   due to miscarriage  . EYE SURGERY Bilateral 2016  .  NOSE SURGERY    . SPINE SURGERY  06/1996   back surgery L5-S1 Dr Jenean Lindau  . VAGINAL HYSTERECTOMY N/A 03/03/2019   Procedure: HYSTERECTOMY VAGINAL;  Surgeon: Paula Compton, MD;  Location: [facility];  Service: Gynecology;  Laterality: N/A;   No Known Allergies No current facility-administered medications on file prior to encounter.   Current Outpatient Medications on File Prior to Encounter  Medication Sig Dispense Refill  . acetaminophen (TYLENOL) 325 MG tablet Take 2 tablets (650 mg total) by mouth every 4 (four) hours as needed for mild pain (temperature > 101.5.). 30 tablet 1  . lisinopril (ZESTRIL) 10 MG tablet Take 1 tablet (10 mg total) by mouth daily. 90 tablet 1  . loratadine (CLARITIN) 10 MG tablet TAKE 1 TABLET BY MOUTH EVERY DAY AS NEEDED 90 tablet 1  . ciprofloxacin (CIPRO) 500 MG tablet TAKE 1 TABLET BY MOUTH TWICE A DAY 20 tablet 0  . ciprofloxacin (CIPRO) 500 MG tablet Take 1 tablet (500 mg total) by mouth 2 (two) times daily. 20 tablet 0  . doxycycline (VIBRA-TABS) 100 MG tablet Take 1 tablet (100 mg total) by mouth 2 (two) times daily. 30 tablet 0  . fluticasone (FLONASE) 50 MCG/ACT nasal spray Place 2 sprays into both nostrils daily. (Patient taking differently: Place 2 sprays into both nostrils daily as needed for allergies. ) 16 g 12  . ibuprofen (ADVIL) 800 MG tablet Take 1 tablet (800 mg total) by mouth every 8 (eight) hours. 30 tablet 0  . oxyCODONE (OXY IR/ROXICODONE) 5 MG immediate release tablet Take 1-2 tablets (  5-10 mg total) by mouth every 4 (four) hours as needed for moderate pain. 20 tablet 0  . traMADol (ULTRAM) 50 MG tablet Take 1 tablet (50 mg total) by mouth 3 (three) times daily. 90 tablet 2   Social History   Socioeconomic History  . Marital status: Married    Spouse name: Not on file  . Number of children: Not on file  . Years of education: Not on file  . Highest education level: Not on file  Occupational History  . Not on file    Tobacco Use  . Smoking status: Never Smoker  . Smokeless tobacco: Never Used  Vaping Use  . Vaping Use: Never used  Substance and Sexual Activity  . Alcohol use: No    Alcohol/week: 0.0 standard drinks  . Drug use: No  . Sexual activity: Not on file  Other Topics Concern  . 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 Difficulty of Paying Living Expenses: Not on file  Food Insecurity:   . Worried About Charity fundraiser in the Last Year: Not on file  . Ran Out of Food in the Last Year: Not on file  Transportation Needs:   . Lack of Transportation (Medical): Not on file  . Lack of Transportation (Non-Medical): Not on file  Physical Activity:   . Days of Exercise per Week: Not on file  . Minutes of Exercise per Session: Not on file  Stress:   . Feeling of Stress : Not on file  Social Connections:   . Frequency of Communication with Friends and Family: Not on file  . Frequency of Social Gatherings with Friends and Family: Not on file  . Attends Religious Services: Not on file  . Active Member of Clubs or Organizations: Not on file  . Attends Archivist Meetings: Not on file  . Marital Status: Not on file  Intimate Partner Violence:   . Fear of Current or Ex-Partner: Not on file  . Emotionally Abused: Not on file  . Physically Abused: Not on file  . Sexually Abused: Not on file   Family History  Problem Relation Age of Onset  . Heart disease Mother        MI  . Depression Mother   . Heart disease Father        MI  . Depression Father   . Heart disease Maternal Grandmother        MI and CAD  . Depression Paternal Grandfather   . Colon cancer Neg Hx   . Breast cancer Neg Hx     OBJECTIVE:  Vitals:   01/20/20 1540 01/20/20 1545  BP:  (!) 152/67  Pulse:  71  Resp:  15  Temp:  98.4 F (36.9 C)  TempSrc:  Oral   SpO2:  99%  Weight: 170 lb (77.1 kg)   Height: 5\' 4"  (1.626 m)     General appearance: ALERT; in no acute distress.  Head: NCAT Lungs: Normal respiratory effort CV: pulses 2+ bilaterally. Cap refill < 2 seconds Musculoskeletal:  Inspection: Skin warm, dry, clear and intact.  Mild swelling to lateral aspect of left foot  palpation: Lateral aspect of left foot tender to palpation ROM: FROM active and passive Skin: warm and dry Neurologic: Ambulates without difficulty; Sensation intact about the upper/  lower extremities Psychological: alert and cooperative; normal mood and affect  DIAGNOSTIC STUDIES:  No results found.   ASSESSMENT & PLAN:  1. Left foot pain   2. Foot sprain, left, initial encounter     Meds ordered this encounter  Medications  . predniSONE (STERAPRED UNI-PAK 21 TAB) 10 MG (21) TBPK tablet    Sig: Take by mouth daily for 6 days. Take 6 tablets on day 1, 5 tablets on day 2, 4 tablets on day 3, 3 tablets on day 4, 2 tablets on day 5, 1 tablet on day 6    Dispense:  21 tablet    Refill:  0    Order Specific Question:   Supervising Provider    Answer:   Chase Picket [id number]   X-ray negative for any fractures or misalignments today.  Prescribed prednisone for inflammation Foot sprain versus possible gout, most likely foot sprain from activity yesterday Continue conservative management of rest, ice, and gentle stretches Take ibuprofen as needed for pain relief (may cause abdominal discomfort, ulcers, and GI bleeds avoid taking with other NSAIDs) Follow up with PCP if symptoms persist Return or go to the ER if you have any new or worsening symptoms (fever, chills, chest pain, abdominal pain, changes in bowel or bladder habits, pain radiating into lower legs)   Reviewed expectations re: course of current medical issues. Questions answered. Outlined signs and symptoms indicating need for more acute intervention. Patient verbalized understanding. After Visit  Summary given.       Faustino Congress, NP 01/23/20 [phone]

## 2020-01-30 ENCOUNTER — Inpatient Hospital Stay: Payer: BC Managed Care – PPO | Admitting: Family Medicine

## 2020-03-02 ENCOUNTER — Telehealth: Payer: Self-pay | Admitting: Family Medicine

## 2020-03-02 NOTE — Telephone Encounter (Signed)
Patient called.  Patient said she has had headache,sinus pressure,drainage,cough, and fever since Monday.  Patient said she's had to go to Urgent Care the last 2 times she called.  Patient's upset that she can't see her provider.  Patient wants Dr.Duncan to call her.  I let patient know that I could make her an appointment with Saturday clinic tomorrow, but she doesn't want to wait.

## 2020-03-02 NOTE — Telephone Encounter (Signed)
Contacted pt who c/o sinus head pressure, HA, productive cough w/green/yellow sputum and fatigue for 3 days. Pt is not able to take temp but does have chills and thinks she might have a fever. Pt is taking allergy medication and tylenol. Pt reports symptoms worsen as day proceeds. Pt was wondering if PCP would just prescribe abx and cough syrup.  Advised pt she should have a VV on Saturday. Pt did agree. Cannot schedule until the Saturday VV schedule opens at 1pm today. Advised this office will f/u at that time. She said this office can talk to her husband. Advised Morey Hummingbird will be calling her back. Gave ER precautions. Pt verbalized understanding.

## 2020-03-02 NOTE — Telephone Encounter (Signed)
Thanks. App help of all involved.

## 2020-03-02 NOTE — Telephone Encounter (Signed)
[facility] Night - Client TELEPHONE ADVICE RECORD [facility] Patient Name: Miranda Willis Gender: Female DOB: [birth year] Age: 64 Y 51 M 22 D Return Phone Number: [phone] (Primary) Address[place] [address] Client [place] Night - Client Client Site [place] Physician Miranda Willis - MD Contact Type Call Who Is Calling Patient / Member / Family / Caregiver Call Type Triage / Clinical Relationship To Patient Self Return Phone Number [phone] (Primary) Chief Complaint Headache Reason for Call Symptomatic / Request for [place] states she has a headache, cough. Translation No Nurse Assessment Nurse: Miranda Ludwig, RN, Miranda Willis Date/Time (Eastern Time): 03/02/2020 7:50:58 AM Confirm and document reason for call. If symptomatic, describe symptoms. ---Caller states headache and cough. taking allergy med, tylenol. started Monday. sinus pain and pressure. states has fever, has not checked temp Does the patient have any new or worsening symptoms? ---Yes Will a triage be completed? ---Yes Related visit to physician within the last 2 weeks? ---No Does the PT have any chronic conditions? (i.e. diabetes, asthma, this includes High risk factors for pregnancy, etc.) ---Yes List chronic conditions. ---HTN Is this a behavioral health or substance abuse call? ---No Guidelines Guideline Title Affirmed Question Affirmed Notes Nurse Date/Time ([place] Time) COVID-19 - Diagnosed or Suspected Fever present > 3 days (72 hours) Miranda Ludwig, RN, Miranda Willis 03/02/2020 7:53:06 AM Disp. Time Miranda Willis Time) Disposition Final User 03/02/2020 7:56:01 AM Call PCP within 24 Hours Yes Miranda Ludwig, RN, Miranda Willis Caller Disagree/Comply Comply Caller Understands Yes PreDisposition Did not know what to do Care Advice Given Per Guideline PLEASE NOTE: All timestamps contained within this  report are represented as [place] Standard Time. CONFIDENTIALTY NOTICE: This fax transmission is intended only for the addressee. It contains information that is legally privileged, confidential or otherwise protected from use or disclosure. If you are not the intended recipient, you are strictly prohibited from reviewing, disclosing, copying using or disseminating any of this information or taking any action in reliance on or regarding this information. If you have received this fax in error, please notify us immediately by telephone so that we can arrange for its return to [place]. Phone: [phone], Toll-Free: [phone], Fax: [phone] Page: 2 of 2 Call Id: [id number] Care Advice Given Per Guideline * You need to discuss this with your doctor (or NP/PA) within the next 24 hours. CALL PCP WITHIN 24 HOURS: * IF OFFICE WILL BE OPEN: Call the office when it opens tomorrow morning. HOW TO PROTECT OTHERS - WHEN YOU ARE SICK WITH COVID-19: * STAY HOME A MINIMUM OF 10 DAYS: Home isolation is needed for at least 10 days after the symptoms started. Stay home from school or work if you are sick. Do NOT go to religious services, child care centers, shopping, or other public places. Do NOT use public transportation (e.g., bus, taxis, ride-sharing). Do NOT allow any visitors to your home. Leave the house only if you need to seek urgent medical care. * COVER THE COUGH: Cough and sneeze into your shirt sleeve or inner elbow. Don't cough into your hand or the air. If available, cough into a tissue and throw it into a trash can. * [place] HANDS OFTEN: Wash hands often with soap and water. After coughing or sneezing are important times. If soap and water are not available, use an alcohol-based hand sanitizer with at least 60% alcohol, covering all surfaces of your hands and rubbing them together until they feel dry.  Avoid touching your eyes, nose, and mouth with unwashed hands. * WEAR A MASK: Wear a facemask when  around others. Always wear a facemask (if available) if you have to leave your home (such as going to a medical facility). * Cough: Use cough drops. GENERAL CARE ADVICE FOR COVID-19 SYMPTOMS: * Feeling dehydrated: Drink extra liquids. If the air in your home is dry, use a humidifier. * Fever: For fever over 101 F (38.3 C), take acetaminophen every 4 to 6 hours (Adults 650 mg) OR ibuprofen every 6 to 8 hours (Adults 400 mg). Before taking any medicine, read all the instructions on the package. Do not take aspirin unless your doctor has prescribed it for you. * Muscle aches, headache, and other pains: Often this comes and goes with the fever. Take acetaminophen every 4 to 6 hours (Adults 650 mg) OR ibuprofen every 6 to 8 hours (Adults 400 mg). Before taking any medicine, read all the instructions on the package. * OTC COUGH SYRUPS: The most common cough suppressant in OTC cough medications is dextromethorphan. Often the letters 'DM' appear in the name. COUGH MEDICINES: * HOME REMEDY - HONEY: This old home remedy has been shown to help decrease coughing at night. The adult dosage is 2 teaspoons (10 ml) at bedtime. Honey should not be given to infants under one year of age. CALL BACK IF: * Fever over 103 F (39.4 C) * You become worse * Chest pain or difficulty breathing occurs CARE ADVICE given per COVID-19 - DIAGNOSED OR SUSPECTED (Adult) guideline. Referrals REFERRED TO PCP OFFICE

## 2020-03-02 NOTE — Telephone Encounter (Signed)
Patient is scheduled with Dr.Wolfe on 03/03/20 at 9:20.

## 2020-03-03 ENCOUNTER — Ambulatory Visit (INDEPENDENT_AMBULATORY_CARE_PROVIDER_SITE_OTHER): Payer: BC Managed Care – PPO | Admitting: Family Medicine

## 2020-03-03 ENCOUNTER — Encounter: Payer: Self-pay | Admitting: Family Medicine

## 2020-03-03 ENCOUNTER — Other Ambulatory Visit: Payer: Self-pay

## 2020-03-03 VITALS — BP 160/70 | HR 81 | Temp 98.6°F | Wt 174.0 lb

## 2020-03-03 DIAGNOSIS — J0110 Acute frontal sinusitis, unspecified: Secondary | ICD-10-CM

## 2020-03-03 DIAGNOSIS — I10 Essential (primary) hypertension: Secondary | ICD-10-CM

## 2020-03-03 DIAGNOSIS — R011 Cardiac murmur, unspecified: Secondary | ICD-10-CM

## 2020-03-03 MED ORDER — GUAIFENESIN-CODEINE 100-10 MG/5ML PO SOLN: 10.0000 mL | Freq: Four times a day (QID) | ORAL | 0 refills | Status: DC | PRN

## 2020-03-03 MED ORDER — AMOXICILLIN-POT CLAVULANATE 875-125 MG PO TABS: 1.0000 | ORAL_TABLET | Freq: Two times a day (BID) | ORAL | 0 refills | Status: DC

## 2020-03-03 NOTE — Progress Notes (Signed)
Patient: Miranda Willis MRN: [id number] DOB: 16-Jun-1955 PCP: Tonia Ghent, MD     Subjective:  Chief Complaint  Patient presents with  . sinus complaints    HPI: The patient is a 64 y.o. female who presents today for complaints of possible sinus infection. She states symptoms started on Monday with subjective fever and chills, stuffiness and cough. She started to take loratidine, robitussin and tylenol. She also has used her flonase a few days. On Wednesday she had worsening stuffiness, swollen and shut eyes with discharge and feeling achy. She states she has drainage and blowing out chunks of stuff out of her nose. Mucous is green and yellow. She has a frontal headache as well. Her symptoms seem to worsen at 5pm or she is just tired. She states she has had several sinus infections. She has no shortness of breath or wheezing. She denies any pain in her teeth or foul smell. She does have tenderness in her sinuses. She has been around her grand kids who have had snotty noses. She has not been around any covid, but is not out at all.   She has been vaccinated for covid.   Review of Systems  Constitutional: Positive for chills and fatigue. Negative for fever.  HENT: Positive for congestion, postnasal drip, sinus pressure and sinus pain. Negative for ear pain and sore throat.   Eyes: Negative for visual disturbance.  Respiratory: Positive for cough. Negative for shortness of breath and wheezing.   Cardiovascular: Negative for chest pain and palpitations.  Gastrointestinal: Negative for abdominal pain, diarrhea, nausea and vomiting.    Allergies Patient has No Known Allergies.  Past Medical History Patient  has a past medical history of Complication of anesthesia, Hypertension, Osteoarthritis, and PONV (postoperative nausea and vomiting).  Surgical History Patient  has a past surgical history that includes Dilation and curettage of uterus (1985); Nose surgery; Spine surgery  (06/1996); Back surgery (1997); Eye surgery (Bilateral, 2016); Vaginal hysterectomy (N/A, 03/03/2019); Anterior and posterior repair (N/A, 03/03/2019); Cystoscopy (N/A, 03/03/2019); Cystoscopy with ureteroscopy and stent placement (Left, 03/03/2019); Cystoscopy with ureteroscopy and stent placement (Left, 03/03/2019); and Abdominal hysterectomy.  Family History Pateint's family history includes Depression in her father, mother, and paternal grandfather; Heart disease in her father, maternal grandmother, and mother.  Social History Patient  reports that she has never smoked. She has never used smokeless tobacco. She reports that she does not drink alcohol and does not use drugs.    Objective: Vitals:   03/03/20 0906  BP: (!) 160/70  Pulse: 81  Temp: 98.6 F (37 C)  SpO2: 99%  Weight: 174 lb (78.9 kg)    Body mass index is 29.87 kg/m.  Physical Exam Vitals reviewed.  Constitutional:      Appearance: Normal appearance. She is well-developed. She is obese.  HENT:     Head: Normocephalic and atraumatic.     Comments: TTP over frontal sinuses    Right Ear: Tympanic membrane, ear canal and external ear normal.     Left Ear: Tympanic membrane, ear canal and external ear normal.     Nose: Congestion present.     Mouth/Throat:     Comments: +cobblestoning on posterior pharynx  Eyes:     General:        Right eye: No discharge.        Left eye: No discharge.     Extraocular Movements: Extraocular movements intact.     Conjunctiva/sclera: Conjunctivae normal.     Pupils: Pupils  are equal, round, and reactive to light.  Neck:     Thyroid: No thyromegaly.  Cardiovascular:     Rate and Rhythm: Normal rate and regular rhythm.     Heart sounds: Murmur (loud systolic 3/6) heard.   Pulmonary:     Effort: Pulmonary effort is normal.     Breath sounds: Normal breath sounds.  Abdominal:     General: Bowel sounds are normal. There is no distension.     Palpations: Abdomen is soft.      Tenderness: There is no abdominal tenderness.  Musculoskeletal:     Cervical back: Normal range of motion and neck supple.  Lymphadenopathy:     Cervical: No cervical adenopathy.  Skin:    General: Skin is warm and dry.     Capillary Refill: Capillary refill takes less than 2 seconds.     Findings: No rash.  Neurological:     General: No focal deficit present.     Mental Status: She is alert and oriented to person, place, and time.     Cranial Nerves: No cranial nerve deficit.     Coordination: Coordination normal.     Deep Tendon Reflexes: Reflexes normal.  Psychiatric:        Behavior: Behavior normal.        Assessment/plan: 1. Subacute frontal sinusitis Discussed that I think this is more viral in nature. Would do conservative therapy for a few more days and if symptoms persist past 7-10 days can start antibiotic. Sent in course of augmentin. Advised to take with food. Precautions with diarrhea.  Also recommended flonase at night, cool mist humidifier. F/u with pcp if not resolving.   2. Essential hypertension Elevated today. Did not take her medication. Advised f/u visit with her PCP  3. Murmur Loud systolic murmur. She states she has never been told she has a murmur. Advised f/u with her pcp.     This visit occurred during the SARS-CoV-2 public health emergency.  Safety protocols were in place, including screening questions prior to the visit, additional usage of staff PPE, and extensive cleaning of exam room while observing appropriate contact time as indicated for disinfecting solutions.     Return if symptoms worsen or fail to improve.     Orma Flaming, MD [place]  03/03/2020

## 2020-03-03 NOTE — Patient Instructions (Signed)
-please follow up with your PCP for elevated blood pressure and murmur of your heart.   Sent in augmentin to start if symptoms not improving by day 7-10. Recommend cool mist humidifier and flonase nightly.   Sinusitis, Adult Sinusitis is soreness and swelling (inflammation) of your sinuses. Sinuses are hollow spaces in the bones around your face. They are located:  Around your eyes.  In the middle of your forehead.  Behind your nose.  In your cheekbones. Your sinuses and nasal passages are lined with a fluid called mucus. Mucus drains out of your sinuses. Swelling can trap mucus in your sinuses. This lets germs (bacteria, virus, or fungus) grow, which leads to infection. Most of the time, this condition is caused by a virus. What are the causes? This condition is caused by:  Allergies.  Asthma.  Germs.  Things that block your nose or sinuses.  Growths in the nose (nasal polyps).  Chemicals or irritants in the air.  Fungus (rare). What increases the risk? You are more likely to develop this condition if:  You have a weak body defense system (immune system).  You do a lot of swimming or diving.  You use nasal sprays too much.  You smoke. What are the signs or symptoms? The main symptoms of this condition are pain and a feeling of pressure around the sinuses. Other symptoms include:  Stuffy nose (congestion).  Runny nose (drainage).  Swelling and warmth in the sinuses.  Headache.  Toothache.  A cough that may get worse at night.  Mucus that collects in the throat or the back of the nose (postnasal drip).  Being unable to smell and taste.  Being very tired (fatigue).  A fever.  Sore throat.  Bad breath. How is this diagnosed? This condition is diagnosed based on:  Your symptoms.  Your medical history.  A physical exam.  Tests to find out if your condition is short-term (acute) or long-term (chronic). Your doctor may: ? Check your nose for  growths (polyps). ? Check your sinuses using a tool that has a light (endoscope). ? Check for allergies or germs. ? Do imaging tests, such as an MRI or CT scan. How is this treated? Treatment for this condition depends on the cause and whether it is short-term or long-term.  If caused by a virus, your symptoms should go away on their own within 10 days. You may be given medicines to relieve symptoms. They include: ? Medicines that shrink swollen tissue in the nose. ? Medicines that treat allergies (antihistamines). ? A spray that treats swelling of the nostrils. ? Rinses that help get rid of thick mucus in your nose (nasal saline washes).  If caused by bacteria, your doctor may wait to see if you will get better without treatment. You may be given antibiotic medicine if you have: ? A very bad infection. ? A weak body defense system.  If caused by growths in the nose, you may need to have surgery. Follow these instructions at home: Medicines  Take, use, or apply over-the-counter and prescription medicines only as told by your doctor. These may include nasal sprays.  If you were prescribed an antibiotic medicine, take it as told by your doctor. Do not stop taking the antibiotic even if you start to feel better. Hydrate and humidify   Drink enough water to keep your pee (urine) pale yellow.  Use a cool mist humidifier to keep the humidity level in your home above 50%.  Breathe in  steam for 10-15 minutes, 3-4 times a day, or as told by your doctor. You can do this in the bathroom while a hot shower is running.  Try not to spend time in cool or dry air. Rest  Rest as much as you can.  Sleep with your head raised (elevated).  Make sure you get enough sleep each night. General instructions   Put a warm, moist washcloth on your face 3-4 times a day, or as often as told by your doctor. This will help with discomfort.  Wash your hands often with soap and water. If there is no  soap and water, use hand sanitizer.  Do not smoke. Avoid being around people who are smoking (secondhand smoke).  Keep all follow-up visits as told by your doctor. This is important. Contact a doctor if:  You have a fever.  Your symptoms get worse.  Your symptoms do not get better within 10 days. Get help right away if:  You have a very bad headache.  You cannot stop throwing up (vomiting).  You have very bad pain or swelling around your face or eyes.  You have trouble seeing.  You feel confused.  Your neck is stiff.  You have trouble breathing. Summary  Sinusitis is swelling of your sinuses. Sinuses are hollow spaces in the bones around your face.  This condition is caused by tissues in your nose that become inflamed or swollen. This traps germs. These can lead to infection.  If you were prescribed an antibiotic medicine, take it as told by your doctor. Do not stop taking it even if you start to feel better.  Keep all follow-up visits as told by your doctor. This is important. This information is not intended to replace advice given to you by your health care provider. Make sure you discuss any questions you have with your health care provider. Document Revised: 08/31/2017 Document Reviewed: 08/31/2017 Elsevier Patient Education  [place].

## 2020-03-06 ENCOUNTER — Telehealth: Payer: Self-pay

## 2020-03-06 NOTE — Telephone Encounter (Signed)
Pt called reporting she thought she got a VM from this office asking to return the call. There is no note in the chart that anyone had made a call. Pt said she was told by the provider she saw at the Saturday clinic that she should have a f/u apt with the PCP due to a loud systolic murmur. So an apt was made for 12/2/212. Advised if anything is needed before the apt to call the clinic. Pt reported she is feeling much better.  Pt verbalized understanding.

## 2020-03-07 NOTE — Telephone Encounter (Signed)
Thanks

## 2020-03-15 ENCOUNTER — Ambulatory Visit (INDEPENDENT_AMBULATORY_CARE_PROVIDER_SITE_OTHER)
Admission: RE | Admit: 2020-03-15 | Discharge: 2020-03-15 | Disposition: A | Discharge status: Home / Self Care | Payer: BC Managed Care – PPO | Source: Ambulatory Visit | Attending: Family Medicine | Admitting: Family Medicine

## 2020-03-15 ENCOUNTER — Encounter: Payer: Self-pay | Admitting: Family Medicine

## 2020-03-15 ENCOUNTER — Other Ambulatory Visit: Payer: Self-pay

## 2020-03-15 ENCOUNTER — Ambulatory Visit: Payer: BC Managed Care – PPO | Admitting: Family Medicine

## 2020-03-15 VITALS — BP 140/56 | HR 63 | Temp 98.6°F | Ht 64.0 in | Wt 173.8 lb

## 2020-03-15 DIAGNOSIS — M7060 Trochanteric bursitis, unspecified hip: Secondary | ICD-10-CM

## 2020-03-15 DIAGNOSIS — R011 Cardiac murmur, unspecified: Secondary | ICD-10-CM

## 2020-03-15 DIAGNOSIS — M25552 Pain in left hip: Secondary | ICD-10-CM

## 2020-03-15 DIAGNOSIS — M545 Low back pain, unspecified: Secondary | ICD-10-CM

## 2020-03-15 NOTE — Patient Instructions (Addendum)
We'll call about getting an echo set up.  I wouldn't change your meds in the meantime.  Take care.  Glad to see you. Try icing in the meantime, 5 minutes at a time.  Go to the lab on the way out.

## 2020-03-15 NOTE — Progress Notes (Signed)
This visit occurred during the SARS-CoV-2 public health emergency.  Safety protocols were in place, including screening questions prior to the visit, additional usage of staff PPE, and extensive cleaning of exam room while observing appropriate contact time as indicated for disinfecting solutions.  She was seen at [facility], took abx in the meantime.  Sinus pain is better in the meantime.  Some sputum but clearly better than prev.    She had murmur incidentally noted at outside clinic.  D/w pt.  No CP.  Not SOB.  No BLE edema.  [place] but she felt deconditioned at the time, she had trouble keeping up with others.  She is still able to do her baseline activity at home.  State [place] was about 1.5 months ago.  No other sx in the meantime.  She has a lot of joint pain at baseline and she quit drinking pepsi.  She is drinking more water.    She is having more pain walking.  Pain in the L lower back.  Pain waking her at night, laying on that side.  Some weeks are better/worse than others. Taking tylenol for pain.  No radiation down the leg.     She had retired in the meantime.  D/w pt.   Meds, vitals, and allergies reviewed.  ROS: Per HPI unless specifically indicated in ROS section   GEN: nad, alert and oriented HEENT: ncat NECK: supple w/o LA CV: rrr.  Soft early SEM murmur noted.  PULM: ctab, no inc wob ABD: soft, +bs EXT: no edema SKIN: well perfused.   Back not ttp but L greater troch area ttp.    At least 30 minutes were devoted to patient care in this encounter (this can include time spent reviewing the patient's file/history, interviewing and examining the patient, counseling/reviewing plan with patient, ordering referrals, ordering tests, reviewing relevant laboratory or x-ray data, and documenting the encounter).

## 2020-03-18 DIAGNOSIS — M7060 Trochanteric bursitis, unspecified hip: Secondary | ICD-10-CM | POA: Insufficient documentation

## 2020-03-18 DIAGNOSIS — R011 Cardiac murmur, unspecified: Secondary | ICD-10-CM | POA: Insufficient documentation

## 2020-03-18 DIAGNOSIS — I350 Nonrheumatic aortic (valve) stenosis: Secondary | ICD-10-CM | POA: Insufficient documentation

## 2020-03-18 NOTE — Assessment & Plan Note (Signed)
Soft early systolic ejection murmur noted, could be consistent with mild aortic stenosis.  No emergent symptoms.  Okay for outpatient follow-up.  Echo ordered, rationale discussed with patient.  Will await echo and then go from there.  I do not hear a carotid bruit.

## 2020-03-18 NOTE — Assessment & Plan Note (Signed)
Likely the main issue.  Reasonable to check lower back films and also hip films though I do not expect this to be a primary issue from the lower back.  Ice as needed, routine cautions given to patient.  See after visit summary.

## 2020-03-21 ENCOUNTER — Ambulatory Visit (HOSPITAL_COMMUNITY)
Admission: RE | Admit: 2020-03-21 | Discharge: 2020-03-21 | Disposition: A | Discharge status: Home / Self Care | Payer: BC Managed Care – PPO | Source: Ambulatory Visit | Attending: Family Medicine | Admitting: Family Medicine

## 2020-03-21 ENCOUNTER — Other Ambulatory Visit: Payer: Self-pay

## 2020-03-21 DIAGNOSIS — I313 Pericardial effusion (noninflammatory): Secondary | ICD-10-CM | POA: Diagnosis not present

## 2020-03-21 DIAGNOSIS — I350 Nonrheumatic aortic (valve) stenosis: Secondary | ICD-10-CM | POA: Insufficient documentation

## 2020-03-21 DIAGNOSIS — R011 Cardiac murmur, unspecified: Secondary | ICD-10-CM | POA: Insufficient documentation

## 2020-03-21 DIAGNOSIS — I10 Essential (primary) hypertension: Secondary | ICD-10-CM | POA: Diagnosis not present

## 2020-03-21 LAB — ECHOCARDIOGRAM COMPLETE
AR max vel: 1.37 cm2
AV Area VTI: 1.23 cm2
AV Area mean vel: 1.28 cm2
AV Mean grad: 10.5 mmHg
AV Peak grad: 19.9 mmHg
Ao pk vel: 2.23 m/s
Area-P 1/2: 3.72 cm2
Calc EF: 60.1 %
S' Lateral: 2.3 cm
Single Plane A2C EF: 63.4 %
Single Plane A4C EF: 57.8 %

## 2020-03-21 NOTE — Progress Notes (Signed)
  Echocardiogram 2D Echocardiogram has been performed.  Bobbye Charleston 03/21/2020, 12:15 PM

## 2020-03-22 ENCOUNTER — Encounter: Payer: Self-pay | Admitting: Family Medicine

## 2020-04-02 ENCOUNTER — Telehealth: Payer: Self-pay | Admitting: Family Medicine

## 2020-04-02 NOTE — Telephone Encounter (Signed)
Pt called in wanted to know about getting something that can help with her flares up for her hip issue

## 2020-04-04 MED ORDER — PREDNISONE 10 MG PO TABS: ORAL_TABLET | ORAL | 0 refills | Status: DC

## 2020-04-04 NOTE — Telephone Encounter (Signed)
If she is using ice multiple times per day already?  If not, then start that. If so, then let me know.  Thanks.

## 2020-04-04 NOTE — Telephone Encounter (Signed)
I would try prednisone in the meantime with food.  Prescription sent.  Do not take with Aleve or ibuprofen.  If not better then we can refer her to orthopedics.  Thanks.

## 2020-04-04 NOTE — Telephone Encounter (Signed)
Called and spoke with patient who stated that she has been using the ice multiple times per day, but it doesn't seem to be helping. She stated that it was a stabbing pain that comes and goes. Patient stated that she has tried Tylenol and Tramadol, but they do not help. Stated that she knows she is not supposed to be taking Ibuprofen, but it is the only thing, besides heat that helps. She is also trying to change her diet to a healthier option to see if that works. Patient missed work the other day due to the pain.

## 2020-04-05 ENCOUNTER — Telehealth: Payer: Self-pay

## 2020-04-05 NOTE — Telephone Encounter (Signed)
Called and updated patient of recommendations from Dr. Damita Dunnings. Patient verbalized understanding.

## 2020-04-05 NOTE — Telephone Encounter (Signed)
Message sent thru [place] from [place] on how to prednisone.  Per Damita Dunnings:  I would try prednisone in the meantime with food. Prescription sent.  Do not take with Aleve or ibuprofen.  If not better then we can refer her to orthopedics.    Thanks.  Leamon Arnt

## 2020-04-10 ENCOUNTER — Other Ambulatory Visit: Payer: Self-pay | Admitting: Family Medicine

## 2020-08-02 ENCOUNTER — Other Ambulatory Visit: Payer: Self-pay | Admitting: Family Medicine

## 2020-08-15 ENCOUNTER — Other Ambulatory Visit: Payer: Self-pay | Admitting: Family Medicine

## 2020-08-23 ENCOUNTER — Telehealth: Payer: Self-pay

## 2020-08-23 MED ORDER — DICLOFENAC SODIUM 75 MG PO TBEC
75.0000 mg | DELAYED_RELEASE_TABLET | Freq: Two times a day (BID) | ORAL | 1 refills | Status: DC | PRN
Start: 2020-08-23 — End: 2020-11-05

## 2020-08-23 NOTE — Telephone Encounter (Signed)
Diclofenac.  rx sent.  See cautions on med re: taking with food and avoid other nsaids.  If not better, then needs f/u.  Would use prn.  Thanks.

## 2020-08-23 NOTE — Telephone Encounter (Signed)
Please see phone note on 04/02/2020. Sending note to Dr Damita Dunnings and Janett Billow CMA.

## 2020-08-23 NOTE — Telephone Encounter (Signed)
[place] Night - Client Nonclinical Telephone Record AccessNurse Client [facility] Night - Client Client Site [facility] [place] Physician Renford Dills - MD Contact Type Call Who Is Calling Patient / Member / Family / Caregiver Caller Name Jermyn Phone Number [phone] Patient Name Miranda Willis Patient DOB [birth year] Call Type Message Only Information Provided Reason for Call Medication Question / Request Initial Comment Caller states that she would like to know if Dr. Damita Dunnings could prescribe medication for her pain in her back. She believes the prednisone helped for a while but the pain has returned. She believes the medication that helped was ecofenac. Additional Comment Caller states that she would like a refill on the prescription that she has had in the past for back pain since the prednisone is no longer working. She would be prefer a text message from the office due to an issue with her phone. Disp. Time Disposition Final User 08/23/2020 8:02:03 AM General Information Provided Yes Wynema Birch Call Closed By: Wynema Birch Transaction Date/Time: 08/23/2020 7:58:31 AM (ET)

## 2020-08-24 NOTE — Telephone Encounter (Signed)
Left message to return call to our office.  

## 2020-08-27 NOTE — Telephone Encounter (Signed)
Tried to call patient on number provided but could not leave message and called home number but phone line just rings and rings 

## 2020-08-29 NOTE — Telephone Encounter (Signed)
Tried to call patient on number provided but could not leave message and called home number but phone line just rings and rings

## 2020-10-10 ENCOUNTER — Telehealth: Payer: Self-pay | Admitting: *Deleted

## 2020-10-10 ENCOUNTER — Other Ambulatory Visit: Payer: Self-pay

## 2020-10-10 ENCOUNTER — Ambulatory Visit (HOSPITAL_COMMUNITY)
Admission: EM | Admit: 2020-10-10 | Discharge: 2020-10-10 | Disposition: A | Discharge status: Home / Self Care | Payer: BC Managed Care – PPO

## 2020-10-10 ENCOUNTER — Encounter (HOSPITAL_COMMUNITY): Payer: Self-pay

## 2020-10-10 DIAGNOSIS — J069 Acute upper respiratory infection, unspecified: Secondary | ICD-10-CM

## 2020-10-10 MED ORDER — OSELTAMIVIR PHOSPHATE 75 MG PO CAPS: 75.0000 mg | ORAL_CAPSULE | Freq: Every day | ORAL | 0 refills | Status: DC

## 2020-10-10 NOTE — Telephone Encounter (Addendum)
I have encouraged her to get a flu vaccine to prevent this situation.  I do not see where she got one in the meantime.  I hope she did.   I sent a prescription for Tamiflu.  Take 1 pill a day.  If she develops symptoms then take it twice a day. If worse in the meantime then seek evaluation.   I hope she remains asymptomatic and I hope her grandkids feel better soon.  Thanks.

## 2020-10-10 NOTE — Telephone Encounter (Signed)
Mrs. Kincannon left voicemail on triage stating she and her husband Miranda Willis have been exposed to the Flu by their grand kids.  She is asking if Dr. Damita Dunnings will send in Rx for Tamiflu as a precaution in case they come down with it.  Pharmacy [facility] in [place].  Please advise.

## 2020-10-10 NOTE — ED Triage Notes (Signed)
Pt presents with cough, nasal congestion and headache x 1 week.   Reports 3 of her granddaughters  tested positive for Flu yesterday. States they stay with her on [place].

## 2020-10-10 NOTE — ED Provider Notes (Signed)
[place]    CSN: [id number] Arrival date & time: 10/10/20  1726      History   Chief Complaint Chief Complaint  Patient presents with   Cough   Headache   Nasal Congestion    HPI Miranda Willis is a 65 y.o. female.   Patient here for evaluation of cough, congestion, and headaches for the past week.  Reports granddaughters just tested positive for the flu and they are staying with her at the moment.  PCP sent in a prescription for Tamiflu.  Denies any trauma, injury, or other precipitating event.  Denies any specific alleviating or aggravating factors.  Denies any fevers, chest pain, shortness of breath, N/V/D, numbness, tingling, weakness, abdominal pain, or headaches.     The history is provided by the patient.  Cough Associated symptoms: headaches   Headache Associated symptoms: congestion and cough    Past Medical History:  Diagnosis Date   Complication of anesthesia    Hypertension    Murmur    aortic stenosis   Osteoarthritis    PONV (postoperative nausea and vomiting)     Patient Active Problem List   Diagnosis Date Noted   Murmur 03/18/2020   Trochanteric bursitis 03/18/2020   S/P vaginal hysterectomy 03/03/2019   Other social stressor 09/26/2018   Routine general medical examination at a health care facility 11/29/2014   Advance care planning 11/29/2014   Hypertriglyceridemia 10/09/2013   Hyperglycemia 10/09/2013   Rash 11/29/2010   Shoulder pain, left 10/15/2010   Osteoarthritis 06/05/2010   ALLERGIC RHINITIS 01/02/2009   DEGENERATIVE JOINT DISEASE, 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   ROTATOR CUFF SYNDROME, RIGHT 01/03/2008   Essential hypertension 12/15/2007    Past Surgical History:  Procedure Laterality Date   ABDOMINAL HYSTERECTOMY     ANTERIOR AND POSTERIOR REPAIR N/A 03/03/2019   Procedure: ANTERIOR (CYSTOCELE) AND POSTERIOR REPAIR (RECTOCELE);  Surgeon: Paula Compton, MD;  Location: [facility];  Service:  Gynecology;  Laterality: N/A;   [place] N/A 03/03/2019   Procedure: CYSTOSCOPY;  Surgeon: Paula Compton, MD;  Location: [facility];  Service: Gynecology;  Laterality: N/A;   CYSTOSCOPY WITH URETEROSCOPY AND STENT PLACEMENT Left 03/03/2019   Procedure: CYSTOSCOPY WITH URETEROSCOPY AND STENT PLACEMENT;  Surgeon: Paula Compton, MD;  Location: [facility];  Service: Gynecology;  Laterality: Left;   CYSTOSCOPY WITH URETEROSCOPY AND STENT PLACEMENT Left 03/03/2019   Procedure: CYSTOSCOPY WITH URETEROSCOPY AND STENT PLACEMENT;  Surgeon: Cleon Gustin, MD;  Location: [facility];  Service: Urology;  Laterality: Left;   DILATION AND CURETTAGE OF UTERUS  1985   due to miscarriage   EYE SURGERY Bilateral 2016   NOSE SURGERY     SPINE SURGERY  06/1996   back surgery L5-S1 Dr Jenean Lindau   VAGINAL HYSTERECTOMY N/A 03/03/2019   Procedure: HYSTERECTOMY VAGINAL;  Surgeon: Paula Compton, MD;  Location: [facility];  Service: Gynecology;  Laterality: N/A;    OB History   No obstetric history on file.      Home Medications    Prior to Admission medications   Medication Sig Start Date End Date Taking? Authorizing Provider  diclofenac (VOLTAREN) 75 MG EC tablet Take 1 tablet (75 mg total) by mouth 2 (two) times daily as needed. Take with food.  Don't take with ibuprofen or aleve. 08/23/20   Tonia Ghent, MD  fluticasone Asencion Islam) 50 MCG/ACT nasal spray Place 2 sprays into both nostrils  daily. Patient taking differently: Place 2 sprays into both nostrils daily as needed for allergies. 01/18/19   Tonia Ghent, MD  guaiFENesin-codeine 100-10 MG/5ML syrup Take 10 mLs by mouth every 6 (six) hours as needed for cough. 03/03/20   Orma Flaming, MD  lisinopril (ZESTRIL) 10 MG tablet TAKE 1 TABLET BY MOUTH EVERY DAY 08/15/20   Tonia Ghent, MD  loratadine (CLARITIN) 10 MG tablet TAKE 1 TABLET BY MOUTH EVERY  DAY AS NEEDED 08/02/20   Tonia Ghent, MD  oseltamivir (TAMIFLU) 75 MG capsule Take 1 capsule (75 mg total) by mouth daily. 10/10/20   Tonia Ghent, MD    Family History Family History  Problem Relation Age of Onset   Heart disease Mother        MI   Depression Mother    Heart disease Father        MI   Depression Father    Heart disease Maternal Grandmother        MI and CAD   Depression Paternal Grandfather    Colon cancer Neg Hx    Breast cancer Neg Hx     Social History Social History   Tobacco Use   Smoking status: Never   Smokeless tobacco: Never  Vaping Use   Vaping Use: Never used  Substance Use Topics   Alcohol use: No    Alcohol/week: 0.0 standard drinks   Drug use: No     Allergies   Patient has no known allergies.   Review of Systems Review of Systems  HENT:  Positive for congestion.   Respiratory:  Positive for cough.   Neurological:  Positive for headaches.  All other systems reviewed and are negative.   Physical Exam Triage Vital Signs ED Triage Vitals  Enc Vitals Group     BP 10/10/20 1818 (!) 147/59     Pulse Rate 10/10/20 1818 66     Resp 10/10/20 1818 18     Temp 10/10/20 1818 98.2 F (36.8 C)     Temp Source 10/10/20 1818 Oral     SpO2 10/10/20 1818 98 %     Weight --      Height --      Head Circumference --      Peak Flow --      Pain Score 10/10/20 1816 5     Pain Loc --      Pain Edu? --      Excl. in [place]? --    No data found.  Updated Vital Signs BP (!) 147/59 (BP Location: Left Arm)   Pulse 66   Temp 98.2 F (36.8 C) (Oral)   Resp 18   SpO2 98%   Visual Acuity Right Eye Distance:   Left Eye Distance:   Bilateral Distance:    Right Eye Near:   Left Eye Near:    Bilateral Near:     Physical Exam Vitals and nursing note reviewed.  Constitutional:      General: She is not in acute distress.    Appearance: Normal appearance. She is not ill-appearing, toxic-appearing or diaphoretic.  HENT:     Head:  Normocephalic and atraumatic.     Nose: Congestion present. No nasal tenderness.     Right Turbinates: Swollen.     Left Turbinates: Swollen.     Right Sinus: No maxillary sinus tenderness or frontal sinus tenderness.     Left Sinus: No maxillary sinus tenderness or frontal sinus tenderness.  Mouth/Throat:     Mouth: Mucous membranes are moist.  Eyes:     Conjunctiva/sclera: Conjunctivae normal.  Cardiovascular:     Rate and Rhythm: Normal rate and regular rhythm.     Pulses: Normal pulses.     Heart sounds: Normal heart sounds.  Pulmonary:     Effort: Pulmonary effort is normal.     Breath sounds: Normal breath sounds. No stridor. No wheezing, rhonchi or rales.  Abdominal:     General: Abdomen is flat.  Musculoskeletal:        General: Normal range of motion.     Cervical back: Normal range of motion.  Skin:    General: Skin is warm and dry.  Neurological:     General: No focal deficit present.     Mental Status: She is alert and oriented to person, place, and time.  Psychiatric:        Mood and Affect: Mood normal.     UC Treatments / Results  Labs (all labs ordered are listed, but only abnormal results are displayed) Labs Reviewed - No data to display  EKG   Radiology No results found.  Procedures Procedures (including critical care time)  Medications Ordered in UC Medications - No data to display  Initial Impression / Assessment and Plan / UC Course  I have reviewed the triage vital signs and the nursing notes.  Pertinent labs & imaging results that were available during my care of the patient were reviewed by me and considered in my medical decision making (see chart for details).    Assessment negative for red flags or concerns.  Viral URI with cough, likely flu as she has a known exposure.  Recommend taking Tamiflu as prescribed by PCP.  Encourage fluids and rest.  May take Tylenol and/or ibuprofen as needed for pain and fever.  Discussed conservative  symptom management as described in discharge instructions.  Follow-up with primary care as needed. Final Clinical Impressions(s) / UC Diagnoses   Final diagnoses:  Viral URI with cough     Discharge Instructions      You can take Tylenol and/or Ibuprofen as needed for fever reduction and pain relief.   For cough: honey 1/2 to 1 teaspoon (you can dilute the honey in water or another fluid).  You can also use guaifenesin and dextromethorphan for cough. You can use a humidifier for chest congestion and cough.  If you don't have a humidifier, you can sit in the bathroom with the hot shower running.     For sore throat: try warm salt water gargles, cepacol lozenges, throat spray, warm tea or water with lemon/honey, popsicles or ice, or OTC cold relief medicine for throat discomfort.    For congestion: take a daily anti-histamine like Zyrtec, Claritin, and a oral decongestant, such as pseudoephedrine.  You can also use Flonase 1-2 sprays in each nostril daily.    It is important to stay hydrated: drink plenty of fluids (water, gatorade/powerade/pedialyte, juices, or teas) to keep your throat moisturized and help further relieve irritation/discomfort.   Return or go to the Emergency Department if symptoms worsen or do not improve in the next few days.      ED Prescriptions   None    PDMP not reviewed this encounter.   Pearson Forster, NP 10/10/20 1850

## 2020-10-10 NOTE — Discharge Instructions (Addendum)
You can take Tylenol and/or Ibuprofen as needed for fever reduction and pain relief.   For cough: honey 1/2 to 1 teaspoon (you can dilute the honey in water or another fluid).  You can also use guaifenesin and dextromethorphan for cough. You can use a humidifier for chest congestion and cough.  If you don't have a humidifier, you can sit in the bathroom with the hot shower running.     For sore throat: try warm salt water gargles, cepacol lozenges, throat spray, warm tea or water with lemon/honey, popsicles or ice, or OTC cold relief medicine for throat discomfort.    For congestion: take a daily anti-histamine like Zyrtec, Claritin, and a oral decongestant, such as pseudoephedrine.  You can also use Flonase 1-2 sprays in each nostril daily.    It is important to stay hydrated: drink plenty of fluids (water, gatorade/powerade/pedialyte, juices, or teas) to keep your throat moisturized and help further relieve irritation/discomfort.   Return or go to the Emergency Department if symptoms worsen or do not improve in the next few days.

## 2020-10-11 NOTE — Telephone Encounter (Signed)
Patient notified rx was sent. Patient has been having cough and congestion and will take rx BID. Advised patient if she gets worse then to see eval.

## 2020-11-05 ENCOUNTER — Other Ambulatory Visit: Payer: Self-pay | Admitting: Family Medicine

## 2021-01-04 ENCOUNTER — Ambulatory Visit (INDEPENDENT_AMBULATORY_CARE_PROVIDER_SITE_OTHER): Payer: BC Managed Care – PPO | Admitting: Podiatry

## 2021-01-04 ENCOUNTER — Ambulatory Visit (INDEPENDENT_AMBULATORY_CARE_PROVIDER_SITE_OTHER): Payer: BC Managed Care – PPO

## 2021-01-04 ENCOUNTER — Encounter: Payer: Self-pay | Admitting: Podiatry

## 2021-01-04 ENCOUNTER — Other Ambulatory Visit: Payer: Self-pay

## 2021-01-04 DIAGNOSIS — M778 Other enthesopathies, not elsewhere classified: Secondary | ICD-10-CM | POA: Diagnosis not present

## 2021-01-04 DIAGNOSIS — M79671 Pain in right foot: Secondary | ICD-10-CM

## 2021-01-04 DIAGNOSIS — L02619 Cutaneous abscess of unspecified foot: Secondary | ICD-10-CM

## 2021-01-04 DIAGNOSIS — M2041 Other hammer toe(s) (acquired), right foot: Secondary | ICD-10-CM | POA: Diagnosis not present

## 2021-01-04 MED ORDER — TRIAMCINOLONE ACETONIDE 10 MG/ML IJ SUSP
10.0000 mg | Freq: Once | INTRAMUSCULAR | Status: AC
  Administered 2021-01-04: 10 mg

## 2021-01-04 NOTE — Progress Notes (Signed)
Subjective:   Patient ID: Miranda Willis, female   DOB: 65 y.o.   MRN: [id number]   HPI Patient states her digit seems to be getting gradually worse and she is developed pain in her right forefoot over the last month or 2.  Feels like at times she is walking on the bones   ROS      Objective:  Physical Exam  Neurovascular status intact with inflammation around the third MPJ right with fluid buildup around the joint surface and is noted to have digital deformities of the right over left foot with prominent metatarsals     Assessment:  Inflammatory issue with capsulitis of the third MPJ with elevated digits     Plan:  H&P discussed elevated digits its relationship to the plantar pressure and the possibility for someday digital fusions and orthotics to reduce plantar pressure on the feet.  At this time on focusing on the inflammation I did sterile anesthesia of the right forefoot 60 mg like Marcaine mixture sterile prep done aspirated the joint third getting out a small amount of clear fluid injected quarter cc dexamethasone Kenalog applied padding wear rigid shoes if symptoms persist reappoint  X-rays indicate there is no signs of fracture appears to be more related to structure and acute inflammation

## 2021-03-01 ENCOUNTER — Other Ambulatory Visit: Payer: Self-pay | Admitting: Family Medicine

## 2021-03-01 NOTE — Telephone Encounter (Signed)
Patient will be due for yearly appt next month; please call to schedule.

## 2021-03-01 NOTE — Telephone Encounter (Signed)
Called patient and her phone stated call cant be completed

## 2021-03-04 NOTE — Telephone Encounter (Signed)
Unable to lm to schedule appt

## 2021-03-13 IMAGING — MR MR FOOT*R* WO/W CM
6 of 11 series · 26 of 40 positions shown · IV contrast (gadavist)
Comparison: Radiographs 09/08/2019

CLINICAL DATA: Distal foot pain, swelling and redness. Patient
stepped on a nail 2-3 weeks ago.

EXAM:
MRI OF THE RIGHT FOREFOOT WITHOUT AND WITH CONTRAST
TECHNIQUE: Multiplanar, multisequence MR imaging of the right forefoot was
performed before and after the administration of intravenous
contrast.
CONTRAST:  7.5mL GADAVIST GADOBUTROL 1 MMOL/ML IV SOLN

[Series 5: T1 · coronal · 3.0mm · 0.27mm/px · 5 of 35 slices shown (1 of 2)]
[im 1/35]
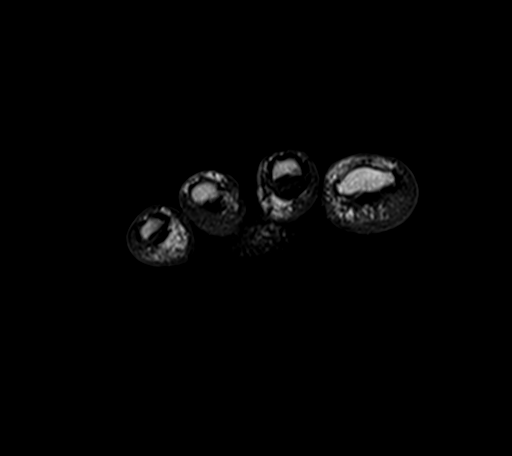
[im 9/35]
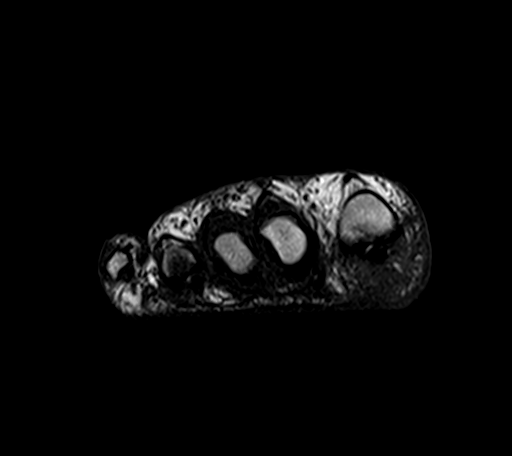
[im 18/35]
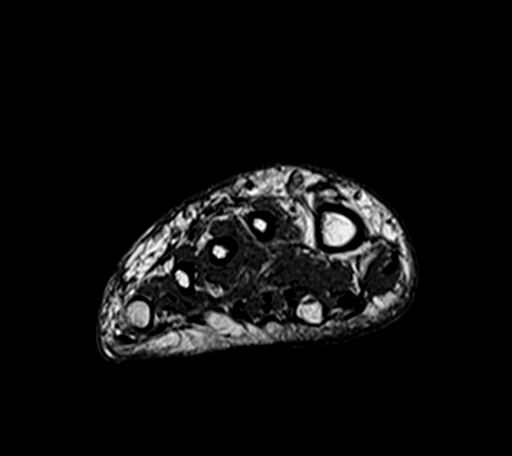
[im 26/35]
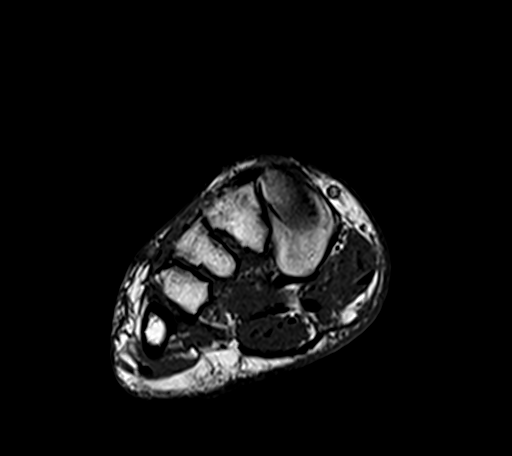
[im 35/35]
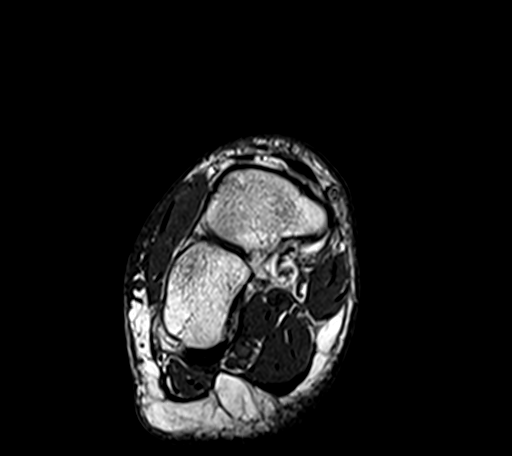

[Series 6: T1 fat-sat · coronal · 3.0mm · 0.27mm/px · 5 of 35 slices shown]
[im 1/35]
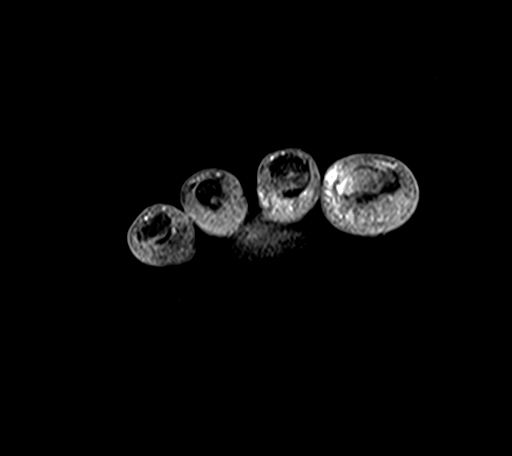
[im 9/35]
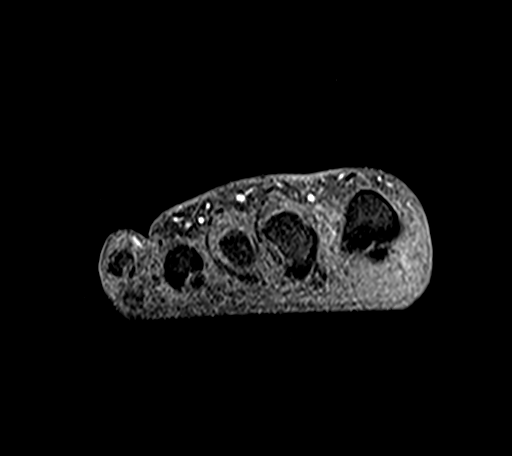
[im 18/35]
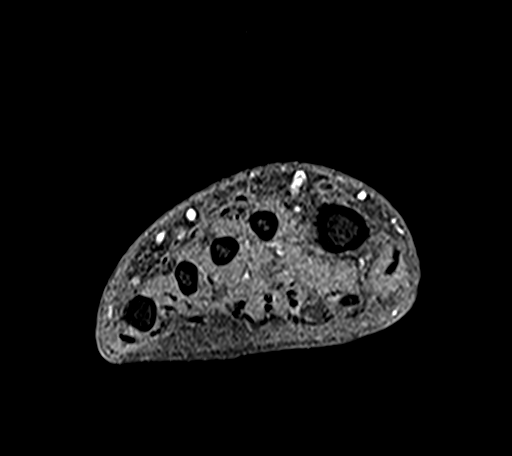
[im 26/35]
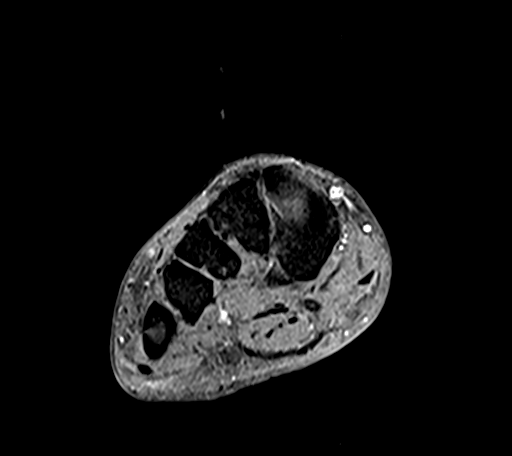
[im 35/35]
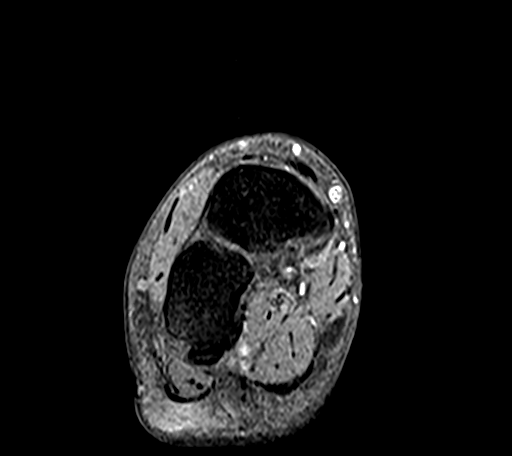

[Series 7: T2 fat-sat · coronal · 3.0mm · 0.55mm/px · 5 of 35 slices shown (1 of 2)]
[im 1/35]
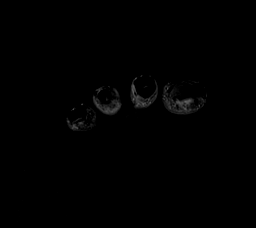
[im 9/35]
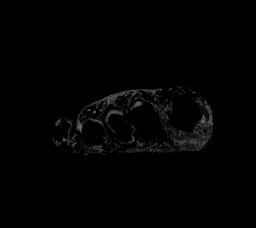
[im 18/35]
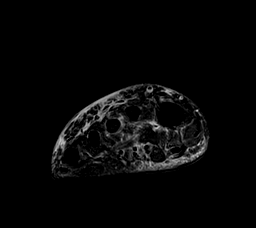
[im 26/35]
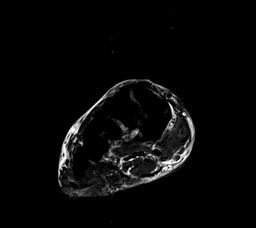
[im 35/35]
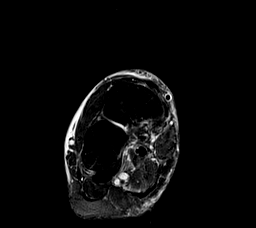

[Series 8: T2 fat-sat · axial · 3.0mm · 0.70mm/px · z∈[-116,-51]mm · 3 of 19 slices shown (2 of 2)]
[im 1/19]
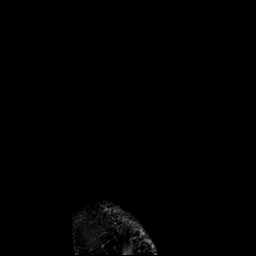
[im 10/19]
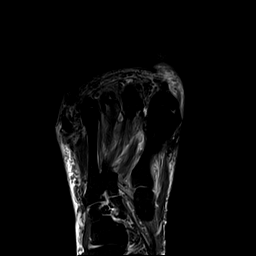
[im 19/19]
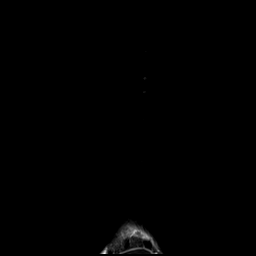

[Series 9: T1 · axial · 3.0mm · 0.35mm/px · z∈[-116,-51]mm · 3 of 19 slices shown (2 of 2)]
[im 1/19]
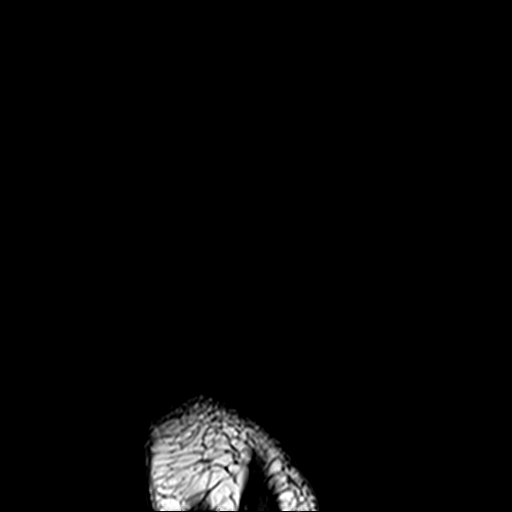
[im 10/19]
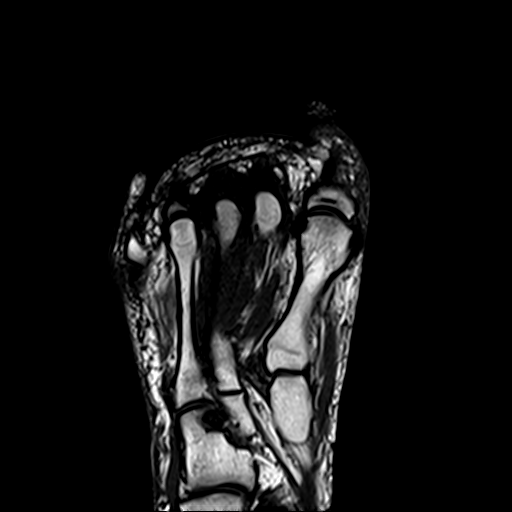
[im 19/19]
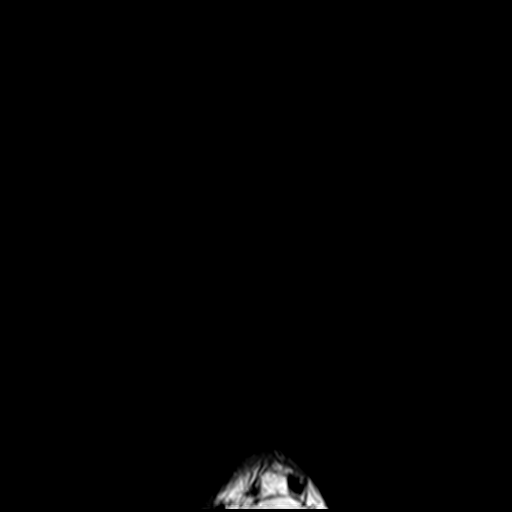

[Series 11: T1 fat-sat post-contrast · coronal · 3.0mm · 0.27mm/px · 5 of 35 slices shown]
[im 1/35]
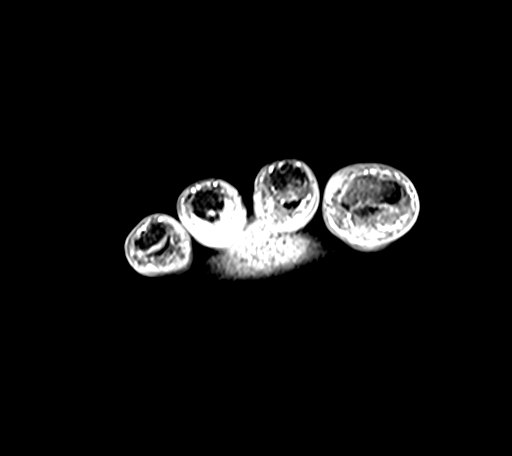
[im 9/35]
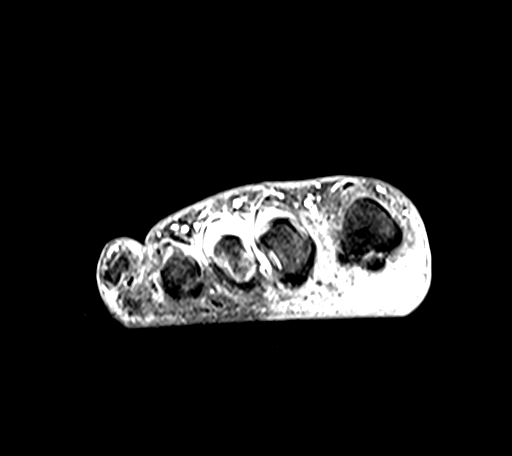
[im 18/35]
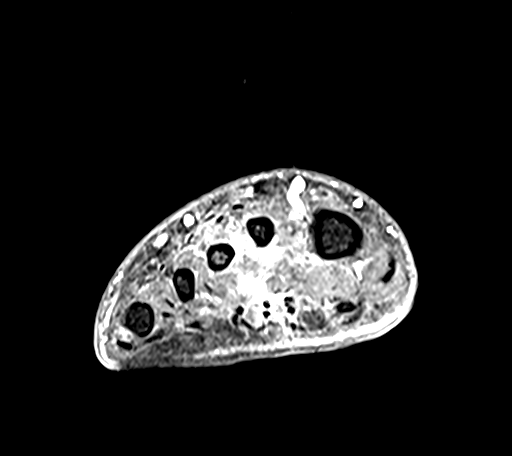
[im 26/35]
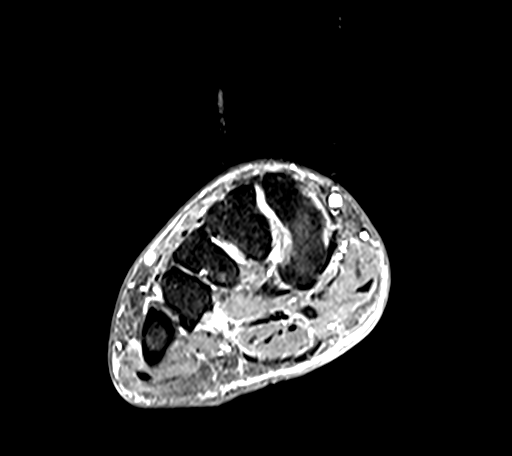
[im 35/35]
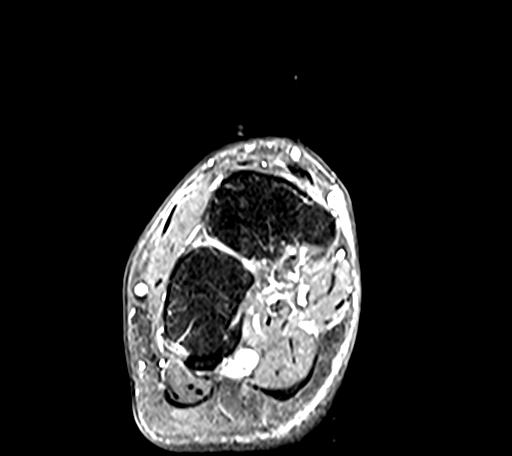

[26 of 40 positions shown; findings below may reference images not displayed]

FINDINGS: There is diffuse subcutaneous soft tissue swelling/edema/fluid
suggesting cellulitis involving the entire forefoot. More focal
inflammatory changes involving the plantar aspect of the first
distal metatarsal region with a small, 13 x 8 mm abscess superficial
to the flexor tendon and medial sesamoid. I do not see any findings
suspicious for septic arthritis or osteomyelitis involving the great
toe.

Fairly significant inflammation and enhancement around the third MTP
joint, proximal phalanx and third metatarsal. There is also a joint
effusion and subtle signal abnormality and enhancement in the third
metatarsal head. Findings worrisome for early septic arthritis and
osteomyelitis.

The other bony structures are intact. No findings suspicious for
pyomyositis. No findings suspicious for septic tenosynovitis.
IMPRESSION: 1. Findings worrisome for early septic arthritis and osteomyelitis
involving the third MTP joint.
2. Cellulitis and small subcutaneous abscess involving the plantar
aspect of the first distal metatarsal region.
3. No findings for septic arthritis or osteomyelitis involving the
first MTP joint

## 2021-03-31 ENCOUNTER — Telehealth: Payer: Self-pay | Admitting: Family Medicine

## 2021-03-31 DIAGNOSIS — I350 Nonrheumatic aortic (valve) stenosis: Secondary | ICD-10-CM

## 2021-03-31 DIAGNOSIS — R011 Cardiac murmur, unspecified: Secondary | ICD-10-CM

## 2021-03-31 NOTE — Telephone Encounter (Signed)
Please check with patient.  Reasonable to get 1 year f/u echo re: aortic valve changes.  I put in the order.  She should get a call about scheduling.  Thanks.

## 2021-04-03 NOTE — Telephone Encounter (Signed)
Called patient just to touch base she has app made for next month will call if any questions.

## 2021-04-05 ENCOUNTER — Other Ambulatory Visit: Payer: Self-pay

## 2021-04-05 ENCOUNTER — Encounter (HOSPITAL_COMMUNITY): Payer: Self-pay

## 2021-04-05 ENCOUNTER — Ambulatory Visit (HOSPITAL_COMMUNITY)
Admission: EM | Admit: 2021-04-05 | Discharge: 2021-04-05 | Disposition: A | Discharge status: Home / Self Care | Payer: BC Managed Care – PPO | Attending: Family Medicine | Admitting: Family Medicine

## 2021-04-05 ENCOUNTER — Telehealth (HOSPITAL_COMMUNITY): Payer: Self-pay | Admitting: Family Medicine

## 2021-04-05 DIAGNOSIS — R509 Fever, unspecified: Secondary | ICD-10-CM | POA: Diagnosis not present

## 2021-04-05 DIAGNOSIS — J069 Acute upper respiratory infection, unspecified: Secondary | ICD-10-CM

## 2021-04-05 DIAGNOSIS — H9202 Otalgia, left ear: Secondary | ICD-10-CM | POA: Diagnosis not present

## 2021-04-05 DIAGNOSIS — U071 COVID-19: Secondary | ICD-10-CM | POA: Diagnosis not present

## 2021-04-05 DIAGNOSIS — R059 Cough, unspecified: Secondary | ICD-10-CM | POA: Insufficient documentation

## 2021-04-05 DIAGNOSIS — I10 Essential (primary) hypertension: Secondary | ICD-10-CM | POA: Diagnosis not present

## 2021-04-05 DIAGNOSIS — Z7901 Long term (current) use of anticoagulants: Secondary | ICD-10-CM | POA: Diagnosis not present

## 2021-04-05 LAB — POC INFLUENZA A AND B ANTIGEN (URGENT CARE ONLY)
INFLUENZA A ANTIGEN, POC: NEGATIVE
INFLUENZA B ANTIGEN, POC: NEGATIVE

## 2021-04-05 LAB — SARS CORONAVIRUS 2 (TAT 6-24 HRS): SARS Coronavirus 2: POSITIVE — AB

## 2021-04-05 MED ORDER — CHERATUSSIN AC 100-10 MG/5ML PO SOLN: 5.0000 mL | Freq: Four times a day (QID) | ORAL | 0 refills | Status: DC | PRN

## 2021-04-05 NOTE — Telephone Encounter (Signed)
Pt contacted [facility] [place] [facility] to [facility] other pharmacy being out of stock. Provider notified to resent. Pt aware.

## 2021-04-05 NOTE — ED Provider Notes (Addendum)
[place]    CSN: [id number] Arrival date & time: 04/05/21  5784      History   Chief Complaint Chief Complaint  Patient presents with   Cough    HPI Miranda Willis is a 65 y.o. female.    Cough Here for h/o cough, nasal congestion and postnasal dc since yesterday. Some fever, and chills. Not too much malaise or aches. A little left ear pain. No n/v/d.  Does have h/o htn, on lisinopril, today bp is 164/55.  Was exposed to grandkids who tested positive for flu B, last week.  Past Medical History:  Diagnosis Date   Complication of anesthesia    Hypertension    Murmur    aortic stenosis   Osteoarthritis    PONV (postoperative nausea and vomiting)     Patient Active Problem List   Diagnosis Date Noted   Murmur 03/18/2020   Trochanteric bursitis 03/18/2020   S/P vaginal hysterectomy 03/03/2019   Other social stressor 09/26/2018   Routine general medical examination at a health care facility 11/29/2014   Advance care planning 11/29/2014   Hypertriglyceridemia 10/09/2013   Hyperglycemia 10/09/2013   Rash 11/29/2010   Shoulder pain, left 10/15/2010   Osteoarthritis 06/05/2010   ALLERGIC RHINITIS 01/02/2009   DEGENERATIVE JOINT DISEASE, 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   ROTATOR CUFF SYNDROME, RIGHT 01/03/2008   Essential hypertension 12/15/2007    Past Surgical History:  Procedure Laterality Date   ABDOMINAL HYSTERECTOMY     ANTERIOR AND POSTERIOR REPAIR N/A 03/03/2019   Procedure: ANTERIOR (CYSTOCELE) AND POSTERIOR REPAIR (RECTOCELE);  Surgeon: Paula Compton, MD;  Location: [facility];  Service: Gynecology;  Laterality: N/A;   [place] N/A 03/03/2019   Procedure: CYSTOSCOPY;  Surgeon: Paula Compton, MD;  Location: [facility];  Service: Gynecology;  Laterality: N/A;   CYSTOSCOPY WITH URETEROSCOPY AND STENT PLACEMENT Left 03/03/2019   Procedure: CYSTOSCOPY WITH URETEROSCOPY AND STENT  PLACEMENT;  Surgeon: Paula Compton, MD;  Location[facility];  Service: Gynecology;  Laterality: Left;   CYSTOSCOPY WITH URETEROSCOPY AND STENT PLACEMENT Left 03/03/2019   Procedure: CYSTOSCOPY WITH URETEROSCOPY AND STENT PLACEMENT;  Surgeon: Cleon Gustin, MD;  Location: [facility];  Service: Urology;  Laterality: Left;   DILATION AND CURETTAGE OF UTERUS  1985   due to miscarriage   EYE SURGERY Bilateral 2016   NOSE SURGERY     SPINE SURGERY  06/1996   back surgery L5-S1 Dr Jenean Lindau   VAGINAL HYSTERECTOMY N/A 03/03/2019   Procedure: HYSTERECTOMY VAGINAL;  Surgeon: Paula Compton, MD;  Location: [facility];  Service: Gynecology;  Laterality: N/A;    OB History   No obstetric history on file.      Home Medications    Prior to Admission medications   Medication Sig Start Date End Date Taking? Authorizing Provider  guaiFENesin-codeine (CHERATUSSIN AC) 100-10 MG/5ML syrup Take 5 mLs by mouth 4 (four) times daily as needed for cough. 04/05/21  Yes Barrett Henle, MD  diclofenac (VOLTAREN) 75 MG EC tablet TAKE 1 TABLET 2 (TWO) TIMES DAILY AS NEEDED. TAKE WITH FOOD. DON'T TAKE WITH IBUPROFEN OR ALEVE. 11/05/20   Tonia Ghent, MD  fluticasone [facility]) 50 MCG/ACT nasal spray Place 2 sprays into both nostrils daily. Patient taking differently: Place 2 sprays into both nostrils daily as needed for allergies. 01/18/19   Tonia Ghent, MD  hydrOXYzine (ATARAX/VISTARIL) 25 MG tablet Take 25 mg  by mouth daily as needed. 09/02/20   [provider]  lisinopril (ZESTRIL) 10 MG tablet TAKE 1 TABLET BY MOUTH EVERY DAY 03/01/21   Tonia Ghent, MD  loratadine (CLARITIN) 10 MG tablet TAKE 1 TABLET BY MOUTH EVERY DAY AS NEEDED 08/02/20   Tonia Ghent, MD    Family History Family History  Problem Relation Age of Onset   Heart disease Mother        MI   Depression Mother    Heart disease Father        MI   Depression  Father    Heart disease Maternal Grandmother        MI and CAD   Depression Paternal Grandfather    Colon cancer Neg Hx    Breast cancer Neg Hx     Social History Social History   Tobacco Use   Smoking status: Never   Smokeless tobacco: Never  Vaping Use   Vaping Use: Never used  Substance Use Topics   Alcohol use: No    Alcohol/week: 0.0 standard drinks   Drug use: No     Allergies   Patient has no known allergies.   Review of Systems Review of Systems  Respiratory:  Positive for cough.     Physical Exam Triage Vital Signs ED Triage Vitals  Enc Vitals Group     BP 04/05/21 0824 (!) 164/55     Pulse Rate 04/05/21 0824 94     Resp 04/05/21 0824 18     Temp 04/05/21 0824 99.9 F (37.7 C)     Temp Source 04/05/21 0824 Oral     SpO2 04/05/21 0824 99 %     Weight --      Height --      Head Circumference --      Peak Flow --      Pain Score 04/05/21 0825 4     Pain Loc --      Pain Edu? --      Excl. in [place]? --    No data found.  Updated Vital Signs BP (!) 164/55 (BP Location: Left Arm)    Pulse 94    Temp 99.9 F (37.7 C) (Oral)    Resp 18    SpO2 99%   Visual Acuity Right Eye Distance:   Left Eye Distance:   Bilateral Distance:    Right Eye Near:   Left Eye Near:    Bilateral Near:     Physical Exam Vitals reviewed.  Constitutional:      General: She is not in acute distress.    Appearance: She is not toxic-appearing.  HENT:     Right Ear: Tympanic membrane and ear canal normal.     Left Ear: Tympanic membrane and ear canal normal.     Nose: Congestion present.     Mouth/Throat:     Mouth: Mucous membranes are moist.     Pharynx: Oropharyngeal exudate (clear mucus draining) and posterior oropharyngeal erythema (mild erythema of the posterior OP) present.  Eyes:     Extraocular Movements: Extraocular movements intact.     Conjunctiva/sclera: Conjunctivae normal.     Pupils: Pupils are equal, round, and reactive to light.  Cardiovascular:      Rate and Rhythm: Normal rate and regular rhythm.     Heart sounds: No murmur heard. Pulmonary:     Effort: Pulmonary effort is normal. No respiratory distress.     Breath sounds: No stridor. No wheezing, rhonchi  or rales.  Musculoskeletal:     Cervical back: Neck supple.  Lymphadenopathy:     Cervical: No cervical adenopathy.  Skin:    Capillary Refill: Capillary refill takes less than 2 seconds.     Coloration: Skin is not jaundiced or pale.  Neurological:     General: No focal deficit present.     Mental Status: She is alert and oriented to person, place, and time.  Psychiatric:        Behavior: Behavior normal.     UC Treatments / Results  Labs (all labs ordered are listed, but only abnormal results are displayed) Labs Reviewed  SARS CORONAVIRUS 2 (TAT 6-24 HRS)  POC INFLUENZA A AND B ANTIGEN (URGENT CARE ONLY)    EKG   Radiology No results found.  Procedures Procedures (including critical care time)  Medications Ordered in UC Medications - No data to display  Initial Impression / Assessment and Plan / UC Course  I have reviewed the triage vital signs and the nursing notes.  Pertinent labs & imaging results that were available during my care of the patient were reviewed by me and considered in my medical decision making (see chart for details).     Flu neg.  Codeine cough syrup to help symptoms Final Clinical Impressions(s) / UC Diagnoses   Final diagnoses:  Viral URI with cough     Discharge Instructions      Your flu test was negative.   You have been swabbed for COVID, and the test will result in the next 24 hours. Our staff will call you if positive. If the test is positive, you should quarantine for 5 days.   Take the robitussin with codeine as needed for the cough.     ED Prescriptions     Medication Sig Dispense Auth. Provider   guaiFENesin-codeine (CHERATUSSIN AC) 100-10 MG/5ML syrup Take 5 mLs by mouth 4 (four) times daily as  needed for cough. 120 mL Barrett Henle, MD      I have reviewed the PDMP during this encounter.   Barrett Henle, MD 04/05/21 [phone]    Barrett Henle, MD 04/05/21 [phone]

## 2021-04-05 NOTE — Discharge Instructions (Addendum)
Your flu test was negative.   You have been swabbed for COVID, and the test will result in the next 24 hours. Our staff will call you if positive. If the test is positive, you should quarantine for 5 days.   Take the robitussin with codeine as needed for the cough.

## 2021-04-05 NOTE — ED Triage Notes (Signed)
Pt c/o cough, nasal congestion, and headache since yesterday. States taking OTC meds with no relief.

## 2021-04-05 NOTE — Telephone Encounter (Signed)
[facility] [place] due to the one in [place] not having the med

## 2021-05-01 ENCOUNTER — Other Ambulatory Visit: Payer: Self-pay

## 2021-05-01 ENCOUNTER — Ambulatory Visit (INDEPENDENT_AMBULATORY_CARE_PROVIDER_SITE_OTHER): Payer: BC Managed Care – PPO

## 2021-05-01 DIAGNOSIS — I350 Nonrheumatic aortic (valve) stenosis: Secondary | ICD-10-CM | POA: Diagnosis not present

## 2021-05-01 DIAGNOSIS — R011 Cardiac murmur, unspecified: Secondary | ICD-10-CM

## 2021-05-01 LAB — ECHOCARDIOGRAM COMPLETE
AR max vel: 1.85 cm2
AV Area VTI: 2.04 cm2
AV Area mean vel: 1.99 cm2
AV Mean grad: 12 mmHg
AV Peak grad: 22.8 mmHg
Ao pk vel: 2.39 m/s
Area-P 1/2: 2.64 cm2
Calc EF: 53.6 %
S' Lateral: 2.6 cm
Single Plane A2C EF: 55.8 %
Single Plane A4C EF: 53.7 %

## 2021-05-07 ENCOUNTER — Telehealth: Payer: Self-pay | Admitting: Family Medicine

## 2021-05-07 NOTE — Telephone Encounter (Signed)
See result notes. 

## 2021-05-07 NOTE — Telephone Encounter (Signed)
Pt called stating that she had an appt with the Echocardiogram on 05/01/21. Pt would like a call back to explain her readings. Please advise.

## 2021-06-07 ENCOUNTER — Telehealth: Payer: Self-pay

## 2021-06-07 NOTE — Telephone Encounter (Signed)
I spoke with pt's husband(DPR signed) pt started with symptoms 06/07/21 in AM with upper abd pain with pain level now is 4-5; pt had described pain as trapped gas  pain. Pt has vomited x 2 this morning with the last episode at 12 noon. Mr Petree said pt cannot keep solids or liquids down. Pt has not seen any blood in vomitus or stool. Pt has not had diarrhea or constipation. Pt is having normal BMs. Pt is not having any CP or SOB. Pts husband is going to pharmacy to pick up med sent to pharmacy for vomiting by Access nurse ( on bottom of access note appears zofran # 6 was sent to pharmacy.)Pt does have H/A with pain level of 3 - 4. Mr Dingus said he does not think pt needs an appt. Pt does not have dry mouth or dizziness or lightheadedness. UC & ED precautions given and pt's husband voiced understanding. Sending note to Dr Damita Dunnings and Janett Billow CMA.

## 2021-06-07 NOTE — Telephone Encounter (Signed)
[facility] Willis - Client TELEPHONE ADVICE RECORD [facility] Patient Name: Miranda Willis Gender: Female DOB: 20-Sep-1955 Age: 66 Y 43 M 27 D Return Phone Number: [phone] (Primary) Address: [place]  [address] Client Miranda Willis - Client Client Site [place] [facility] Provider Renford Dills - MD Contact Type Call Who Is Calling Patient / Member / Family / Caregiver Call Type Triage / Clinical Caller Name Miranda Willis Relationship To Patient Other Return Phone Number [phone] (Primary) Chief Complaint Vomiting Reason for Call Symptomatic / Request for [name] states, wife is vomiting and stomach pain. Translation No Nurse Assessment Nurse: [name], RN, [name] Date/Time (Eastern Time): 06/07/2021 11:11:49 AM Confirm and document reason for call. If symptomatic, describe symptoms. ---Caller states, wife is vomiting and stomach pain. granddtr had it yesterday.grad dtr with them today. no fever. no diarrhea. vomiting 2 times. no blood or dark green. pain is a 5 . caller has a headache as well. Does the patient have any new or worsening symptoms? ---Yes Will a triage be completed? ---Yes Related visit to physician within the last 2 weeks? ---No Does the PT have any chronic conditions? (i.e. diabetes, asthma, this includes High risk factors for pregnancy, etc.) ---Yes List chronic conditions. ---htn Is this a behavioral health or substance abuse call? ---No Nurse: [name], RN, [name] Date/Time (Eastern Time): 06/07/2021 11:22:35 AM Please select the assessment type ---Standing order Additional Documentation ---zofran 4 mg 1 tablet by Mouth every 8 hrs. #6. Other current medications? ---Yes List current medications. ---lisinopril allergy med dacocobate Medication allergies? ---No Pharmacy name and phone number. ---[name] PLEASE  NOTE: All timestamps contained within this report are represented as [place] Standard Time. CONFIDENTIALTY NOTICE: This fax transmission is intended only for the addressee. It contains information that is legally privileged, confidential or otherwise protected from use or disclosure. If you are not the intended recipient, you are strictly prohibited from reviewing, disclosing, copying using or disseminating any of this information or taking any action in reliance on or regarding this information. If you have received this fax in error, please notify us immediately by telephone so that we can arrange for its return to [place]. Phone: [phone], Toll-Free: [phone], Fax: [phone] Page: 2 of 3 Call Id: [id number] Guidelines Guideline Title Affirmed Question Affirmed Notes Nurse Date/Time [name] Time) Vomiting MILD or MODERATE vomiting (e.g., 1 - 5 times / [place]) [name], RN, [name] 06/07/2021 11:15:32 AM Disp. Time [name] Time) Disposition Final User 06/07/2021 11:25:16 [name], RN, [name] Disagree/Comply Comply Caller Understands Yes PreDisposition Did not know what to do Care Advice Given Per Guideline HOME CARE: * You should be able to treat this at home. REASSURANCE AND EDUCATION: * Vomiting can be caused by many things. It is often caused by a stomach virus (stomach flu) or mild food poisoning. * Staying well-hydrated is the most important thing. CLEAR LIQUIDS: * Try to sip small amounts (1 tablespoon or 15 ml) of liquid frequently (every 5 minutes), rather than trying to drink a lot of liquid all at one time. * Sip water or a 1/2 strength sports drink (e.g., Gatorade or Powerade). SOLID FOOD: * You may begin eating bland foods after 8 hours without vomiting. * Start with saltine crackers, white bread, rice, mashed potatoes, cereal, applesauce, etc. CARE ADVICE per Vomiting (Adult) guideline. * You become worse * Constant stomach pain lasting more than 2  hours * Signs of dehydration (e.g., no urination over 12 hours, very lightheaded) * Diarrhea lasts more than 7 days * Vomit contains bile (green color) * Vomiting lasts more than 2 days (48 hours) CALL BACK IF: * You can resume a normal diet in 24 to 48 hours. * Other options: 1/2 strength flat lemon-lime soda or ginger ale. * After 4 hours without vomiting, increase the amount. * Vomiting from viral gastritis (stomach flu) usually stops in 12 to 48 hours. * If diarrhea is present, it usually continues for several days. EXPECTED COURSE: Standing Orders Preparation Additional Instructions Route FrequencyDuration Nurse Comments User Name Zofran (regular tablet or ODT) 4mg  1 tablet # 6, ONLY if not able to make appt within recommended time frame Oral Every 8 Hours [name], Therapist, sports, [employer] Comments User: [name], RN Date/Time [name] Time): 06/07/2021 11:28:35 AM order called in to pharmac

## 2021-06-07 NOTE — Telephone Encounter (Signed)
Agreed, esp with UC eval if not better. Thanks.

## 2021-06-13 ENCOUNTER — Other Ambulatory Visit: Payer: Self-pay | Admitting: Family Medicine

## 2021-06-13 NOTE — Telephone Encounter (Signed)
Refill request for Lisinopril 10 mg tabs ? ?LOV - 03/15/20 ?Next OV - not scheduled ?Last refill - 03/01/21 #90/0 ? ?

## 2021-06-13 NOTE — Telephone Encounter (Signed)
Patient overdue for appt (LOV was 03/15/20). Please call to schedule.  ?

## 2021-06-14 NOTE — Telephone Encounter (Signed)
Please send this to the front for scheduling.  I was unable to route it.  Thanks.  ?

## 2021-06-14 NOTE — Telephone Encounter (Signed)
Lvm for pt to call and schedule a f/u and also sent my chart letter ?

## 2021-09-22 ENCOUNTER — Other Ambulatory Visit: Payer: Self-pay | Admitting: Family Medicine

## 2021-09-23 ENCOUNTER — Other Ambulatory Visit: Payer: Self-pay | Admitting: Family Medicine

## 2021-11-03 ENCOUNTER — Telehealth: Payer: Self-pay | Admitting: Family Medicine

## 2021-11-04 NOTE — Telephone Encounter (Signed)
Patient has not been seen since 03/2020. Patient is past due for a CPE. Please call to schedule

## 2021-11-04 NOTE — Telephone Encounter (Signed)
Tried to reach patient, but phone not connecting.

## 2021-11-08 ENCOUNTER — Other Ambulatory Visit: Payer: Self-pay | Admitting: Family Medicine

## 2021-11-08 NOTE — Telephone Encounter (Signed)
Refill request for lisinopril 10 mg tabs and loratadine 10 mg tabs  LOV - 03/15/20 Next OV - 11/12/21 Last refill - Lisinopril 06/14/21 #90/0                   Loratadine 08/02/20 #90/3

## 2021-11-08 NOTE — Telephone Encounter (Signed)
Patient called back and scheduled for next available which is 2 pm on 11/12/21

## 2021-11-10 ENCOUNTER — Other Ambulatory Visit: Payer: Self-pay | Admitting: Family Medicine

## 2021-11-10 DIAGNOSIS — I10 Essential (primary) hypertension: Secondary | ICD-10-CM

## 2021-11-10 DIAGNOSIS — R739 Hyperglycemia, unspecified: Secondary | ICD-10-CM

## 2021-11-11 ENCOUNTER — Other Ambulatory Visit (INDEPENDENT_AMBULATORY_CARE_PROVIDER_SITE_OTHER): Payer: BC Managed Care – PPO

## 2021-11-11 DIAGNOSIS — R739 Hyperglycemia, unspecified: Secondary | ICD-10-CM | POA: Diagnosis not present

## 2021-11-11 DIAGNOSIS — I10 Essential (primary) hypertension: Secondary | ICD-10-CM | POA: Diagnosis not present

## 2021-11-11 LAB — COMPREHENSIVE METABOLIC PANEL
ALT: 19 U/L (ref 0–35)
AST: 15 U/L (ref 0–37)
Albumin: 4.5 g/dL (ref 3.5–5.2)
Alkaline Phosphatase: 60 U/L (ref 39–117)
BUN: 15 mg/dL (ref 6–23)
CO2: 26 mEq/L (ref 19–32)
Calcium: 10 mg/dL (ref 8.4–10.5)
Chloride: 104 mEq/L (ref 96–112)
Creatinine, Ser: 0.76 mg/dL (ref 0.40–1.20)
GFR: 82.57 mL/min (ref >60.00)
Glucose, Bld: 109 mg/dL — ABNORMAL HIGH (ref 70–99)
Potassium: 4.7 mEq/L (ref 3.5–5.1)
Sodium: 140 mEq/L (ref 135–145)
Total Bilirubin: 0.4 mg/dL (ref 0.2–1.2)
Total Protein: 7.6 g/dL (ref 6.0–8.3)

## 2021-11-11 LAB — LIPID PANEL
Cholesterol: 179 mg/dL (ref 0–200)
HDL: 38.5 mg/dL — ABNORMAL LOW (ref >39.00)
NonHDL: 140.09
Total CHOL/HDL Ratio: 5
Triglycerides: 207 mg/dL — ABNORMAL HIGH (ref 0.0–149.0)
VLDL: 41.4 mg/dL — ABNORMAL HIGH (ref 0.0–40.0)

## 2021-11-11 LAB — HEMOGLOBIN A1C: Hgb A1c MFr Bld: 6.2 % (ref 4.6–6.5)

## 2021-11-11 LAB — LDL CHOLESTEROL, DIRECT: Direct LDL: 117 mg/dL

## 2021-11-12 ENCOUNTER — Ambulatory Visit (INDEPENDENT_AMBULATORY_CARE_PROVIDER_SITE_OTHER): Payer: BC Managed Care – PPO | Admitting: Family Medicine

## 2021-11-12 ENCOUNTER — Encounter: Payer: Self-pay | Admitting: Family Medicine

## 2021-11-12 VITALS — BP 130/60 | HR 94 | Temp 97.9°F | Ht 64.0 in | Wt 161.0 lb

## 2021-11-12 DIAGNOSIS — I10 Essential (primary) hypertension: Secondary | ICD-10-CM

## 2021-11-12 DIAGNOSIS — Z659 Problem related to unspecified psychosocial circumstances: Secondary | ICD-10-CM

## 2021-11-12 DIAGNOSIS — M1990 Unspecified osteoarthritis, unspecified site: Secondary | ICD-10-CM

## 2021-11-12 DIAGNOSIS — Z0000 Encounter for general adult medical examination without abnormal findings: Secondary | ICD-10-CM | POA: Diagnosis not present

## 2021-11-12 DIAGNOSIS — I350 Nonrheumatic aortic (valve) stenosis: Secondary | ICD-10-CM

## 2021-11-12 MED ORDER — HYDROXYZINE HCL 25 MG PO TABS: 25.0000 mg | ORAL_TABLET | Freq: Every day | ORAL | 3 refills | Status: DC | PRN

## 2021-11-12 MED ORDER — ONDANSETRON HCL 4 MG PO TABS: 4.0000 mg | ORAL_TABLET | Freq: Three times a day (TID) | ORAL | 0 refills | Status: DC | PRN

## 2021-11-12 MED ORDER — ETODOLAC 400 MG PO TABS: ORAL_TABLET | ORAL | 3 refills | Status: DC

## 2021-11-12 MED ORDER — LISINOPRIL 10 MG PO TABS: 10.0000 mg | ORAL_TABLET | Freq: Every day | ORAL | 3 refills | Status: DC

## 2021-11-12 NOTE — Patient Instructions (Addendum)
Call us when you want to get set up with the GI clinic for a colonoscopy.   Likely repeat echo in early 2024.   Take care.  Glad to see you. Call about a mammogram.

## 2021-11-12 NOTE — Progress Notes (Unsigned)
CPE- See plan.  Routine anticipatory guidance given to patient.  See health maintenance.  The possibility exists that previously documented standard health maintenance information may have been brought forward from a previous encounter into this note.  If needed, that same information has been updated to reflect the current situation based on today's encounter.    Tetanus 2021 Flu encouraged.   PNA due 66 Shingles d/w pt.   Covid vaccine x2, pfizer. Unrecalled dates.  Done in 2021.  Mammogram due.  She can call about scheduling.  DXA due at 66 Pap 2020 Cologuard neg 2020.  She wanted get a colonoscopy later this year, 2023.  Living will d/w pt.  Husband designated if patient were incapacitated. Diet and exercise.  D/w pt about on both.  We talked about having zofran on hand in case of vomiting- she had prev GI illness.    Etodolac helped more than diclofenac.  D/w pt.  Used for back pain.  NSAID cautions discussed with patient.   Hypertension:               Using medication without problems or lightheadedness: yes Chest pain with exertion:no Edema:no Short of breath:no Labs d/w pt.   H/o aortic stenosis.  Prev Echo d/w pt.  Mild thickening of the heart muscle and mild aortic valve changes were stable.  We can consider rechecking this in ~2024, d/w pt.   Has used hydroxyzine prn.  No ADE on med.  Stressors d/w pt, caring for her grandkids.  Using hydroxyzine prn for insomnia.    Meds, vitals, and allergies reviewed.  ROS: Per HPI.  Unless specifically indicated otherwise in HPI, the patient denies:  General: fever. Eyes: acute vision changes ENT: sore throat Cardiovascular: chest pain Respiratory: SOB GI: vomiting GU: dysuria Musculoskeletal: acute back pain Derm: acute rash Neuro: acute motor dysfunction Psych: worsening mood Endocrine: polydipsia Heme: bleeding Allergy: hayfever  GEN: nad, alert and oriented HEENT: ncat NECK: supple w/o LA CV: rrr. SEM noted.    PULM: ctab, no inc wob ABD: soft, +bs EXT: no edema SKIN: no acute rash

## 2021-11-13 NOTE — Assessment & Plan Note (Signed)
Restart etodolac with routine GI/NSAID cautions.

## 2021-11-13 NOTE — Assessment & Plan Note (Signed)
Has used hydroxyzine prn.  No ADE on med.  Stressors d/w pt, caring for her grandkids.  Using hydroxyzine prn for insomnia.  Continue as is.

## 2021-11-13 NOTE — Assessment & Plan Note (Signed)
Labs discussed with patient.  Continue lisinopril 

## 2021-11-13 NOTE — Assessment & Plan Note (Signed)
H/o aortic stenosis.  Prev Echo d/w pt.  Mild thickening of the heart muscle and mild aortic valve changes were stable.  We can consider rechecking this in ~2024, d/w pt.

## 2021-11-13 NOTE — Assessment & Plan Note (Signed)
Tetanus 2021 Flu encouraged.   PNA due 66 Shingles d/w pt.   Covid vaccine x2, pfizer. Unrecalled dates.  Done in 2021.  Mammogram due.  She can call about scheduling.  DXA due at 66 Pap 2020 Cologuard neg 2020.  She wanted get a colonoscopy later this year, 2023.  Living will d/w pt.  Husband designated if patient were incapacitated. Diet and exercise.  D/w pt about on both.

## 2022-02-06 ENCOUNTER — Encounter: Payer: Self-pay | Admitting: Emergency Medicine

## 2022-02-06 ENCOUNTER — Ambulatory Visit
Admission: EM | Admit: 2022-02-06 | Discharge: 2022-02-06 | Disposition: A | Discharge status: Home / Self Care | Payer: Medicare PPO | Attending: Physician Assistant | Admitting: Physician Assistant

## 2022-02-06 DIAGNOSIS — J0110 Acute frontal sinusitis, unspecified: Secondary | ICD-10-CM

## 2022-02-06 DIAGNOSIS — J209 Acute bronchitis, unspecified: Secondary | ICD-10-CM | POA: Diagnosis not present

## 2022-02-06 MED ORDER — PREDNISONE 10 MG PO TABS: 10.0000 mg | ORAL_TABLET | Freq: Three times a day (TID) | ORAL | 0 refills | Status: DC

## 2022-02-06 MED ORDER — AMOXICILLIN-POT CLAVULANATE 875-125 MG PO TABS: 1.0000 | ORAL_TABLET | Freq: Two times a day (BID) | ORAL | 0 refills | Status: DC

## 2022-02-06 NOTE — ED Provider Notes (Signed)
[facility]    CSN: [id number] Arrival date & time: 02/06/22  4008      History   Chief Complaint Chief Complaint  Patient presents with   Cough   Nasal Congestion   Facial Pain    HPI Miranda Willis is a 66 y.o. female.   66 year old female presents with sinus and chest congestion.  Patient indicates for the past week she has been having persistent upper respiratory sinus congestion with frontal and maxillary pressure and tenderness.  She relates she is having congestion with rhinitis and postnasal drip with the production being purulent.  She also indicates she has been having chest congestion with cough, production is purulent.  Indicates she has been taking some OTC cough preparations which have not controlled her symptoms.  She does relate that she has some codeine-based cough syrup which helps control her coughing spasms.  She indicates she has not have any fever or chills but she did do a COVID test 3 days ago which was negative.  He Clearence Cheek she has not have any nausea or vomiting and she is tolerating fluids well.  She does indicates she has some Flonase nasal spray that she uses intermittently that does tend to help her sinus congestion.   Cough   Past Medical History:  Diagnosis Date   Complication of anesthesia    Hypertension    Murmur    aortic stenosis   Osteoarthritis    PONV (postoperative nausea and vomiting)     Patient Active Problem List   Diagnosis Date Noted   Aortic stenosis 03/18/2020   Trochanteric bursitis 03/18/2020   S/P vaginal hysterectomy 03/03/2019   Other social stressor 09/26/2018   Routine general medical examination at a health care facility 11/29/2014   Advance care planning 11/29/2014   Hypertriglyceridemia 10/09/2013   Hyperglycemia 10/09/2013   Shoulder pain, left 10/15/2010   Osteoarthritis 06/05/2010   ALLERGIC RHINITIS 01/02/2009   DEGENERATIVE JOINT DISEASE, 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   ROTATOR CUFF  SYNDROME, RIGHT 01/03/2008   Essential hypertension 12/15/2007    Past Surgical History:  Procedure Laterality Date   ABDOMINAL HYSTERECTOMY     ANTERIOR AND POSTERIOR REPAIR N/A 03/03/2019   Procedure: ANTERIOR (CYSTOCELE) AND POSTERIOR REPAIR (RECTOCELE);  Surgeon: Paula Compton, MD;  Location: [facility];  Service: Gynecology;  Laterality: N/A;   [place] N/A 03/03/2019   Procedure: CYSTOSCOPY;  Surgeon: Paula Compton, MD;  Location: [facility];  Service: Gynecology;  Laterality: N/A;   CYSTOSCOPY WITH URETEROSCOPY AND STENT PLACEMENT Left 03/03/2019   Procedure: CYSTOSCOPY WITH URETEROSCOPY AND STENT PLACEMENT;  Surgeon: Paula Compton, MD;  [facility]: [facility];  Service: Gynecology;  Laterality: Left;   CYSTOSCOPY WITH URETEROSCOPY AND STENT PLACEMENT Left 03/03/2019   Procedure: CYSTOSCOPY WITH URETEROSCOPY AND STENT PLACEMENT;  Surgeon: Cleon Gustin, MD;  Location: [facility];  Service: Urology;  Laterality: Left;   DILATION AND CURETTAGE OF UTERUS  1985   due to miscarriage   EYE SURGERY Bilateral 2016   NOSE SURGERY     SPINE SURGERY  06/1996   back surgery L5-S1 Dr Jenean Lindau   VAGINAL HYSTERECTOMY N/A 03/03/2019   Procedure: HYSTERECTOMY VAGINAL;  Surgeon: Paula Compton, MD;  [facility];  Service: Gynecology;  Laterality: N/A;    OB History   No obstetric history on file.      Home Medications    Prior to Admission medications  Medication Sig Start Date End Date Taking? Authorizing Provider  amoxicillin-clavulanate (AUGMENTIN) 875-125 MG tablet Take 1 tablet by mouth every 12 (twelve) hours. 02/06/22  Yes Nyoka Lint, PA-C  predniSONE (DELTASONE) 10 MG tablet Take 1 tablet (10 mg total) by mouth 3 (three) times daily. 02/06/22  Yes Nyoka Lint, PA-C  etodolac (LODINE) 400 MG tablet TAKE 1 TABLET (400 MG TOTAL) BY MOUTH 2 (TWO) TIMES  DAILY AS NEEDED (FOR PAIN. TAKE WITH FOOD.). 11/12/21   Tonia Ghent, MD  fluticasone [employer]) 50 MCG/ACT nasal spray Place 2 sprays into both nostrils daily. 01/18/19   Tonia Ghent, MD  hydrOXYzine (ATARAX) 25 MG tablet Take 1 tablet (25 mg total) by mouth daily as needed. 11/12/21   Tonia Ghent, MD  lisinopril (ZESTRIL) 10 MG tablet Take 1 tablet (10 mg total) by mouth daily. 11/12/21   Tonia Ghent, MD  loratadine (CLARITIN) 10 MG tablet TAKE 1 TABLET BY MOUTH EVERY DAY AS NEEDED 11/10/21   Tonia Ghent, MD  ondansetron (ZOFRAN) 4 MG tablet Take 1 tablet (4 mg total) by mouth every 8 (eight) hours as needed for nausea or vomiting. 11/12/21   Tonia Ghent, MD    Family History Family History  Problem Relation Age of Onset   Heart disease Mother        MI   Depression Mother    Heart disease Father        MI   Depression Father    Heart disease Maternal Grandmother        MI and CAD   Depression Paternal Grandfather    Colon cancer Neg Hx    Breast cancer Neg Hx     Social History Social History   Tobacco Use   Smoking status: Never   Smokeless tobacco: Never  Vaping Use   Vaping Use: Never used  Substance Use Topics   Alcohol use: No    Alcohol/week: 0.0 standard drinks of alcohol   Drug use: No     Allergies   Patient has no known allergies.   Review of Systems Review of Systems  HENT:  Positive for sinus pressure and sinus pain.   Respiratory:  Positive for cough.      Physical Exam Triage Vital Signs ED Triage Vitals  Enc Vitals Group     BP 02/06/22 1015 (!) 171/72     Pulse Rate 02/06/22 1015 67     Resp 02/06/22 1015 18     Temp 02/06/22 1015 97.9 F (36.6 C)     Temp src --      SpO2 02/06/22 1015 96 %     Weight --      Height --      Head Circumference --      Peak Flow --      Pain Score 02/06/22 1014 0     Pain Loc --      Pain Edu? --      Excl. in [place]? --    No data found.  Updated Vital Signs BP (!) 171/72    Pulse 67   Temp 97.9 F (36.6 C)   Resp 18   SpO2 96%   Visual Acuity Right Eye Distance:   Left Eye Distance:   Bilateral Distance:    Right Eye Near:   Left Eye Near:    Bilateral Near:     Physical Exam Constitutional:      Appearance: Normal appearance.  HENT:  Right Ear: Ear canal normal. Tympanic membrane is injected.     Left Ear: Ear canal normal. Tympanic membrane is injected.     Mouth/Throat:     Mouth: Mucous membranes are moist.     Pharynx: Oropharynx is clear.  Cardiovascular:     Rate and Rhythm: Normal rate and regular rhythm.     Heart sounds: Normal heart sounds.  Pulmonary:     Effort: Pulmonary effort is normal.     Breath sounds: Normal breath sounds and air entry. No wheezing or rhonchi.  Lymphadenopathy:     Cervical: No cervical adenopathy.  Neurological:     Mental Status: She is alert.      UC Treatments / Results  Labs (all labs ordered are listed, but only abnormal results are displayed) Labs Reviewed - No data to display  EKG   Radiology No results found.  Procedures Procedures (including critical care time)  Medications Ordered in UC Medications - No data to display  Initial Impression / Assessment and Plan / UC Course  I have reviewed the triage vital signs and the nursing notes.  Pertinent labs & imaging results that were available during my care of the patient were reviewed by me and considered in my medical decision making (see chart for details).    Plan: 1.  The acute sinusitis will be treated with the following: A.  Augmentin 875 mg every 12 hours to treat infection. 2.  The acute bronchitis will be treated with the following: A.  Augmentin 875 mg 1 every 12 hours to treat infection. B.  Prednisone 10 mg, 1 3 times a day for 5 days only to help treat the inflammatory respiratory component. 3.  Patient advised to continue Flonase nasal spray, 2 sprays each nostril once daily to decrease sinus congestion. 4.   Patient advised to follow-up PCP or return to urgent care if symptoms fail to improve. Final Clinical Impressions(s) / UC Diagnoses   Final diagnoses:  Acute non-recurrent frontal sinusitis  Acute bronchitis, unspecified organism     Discharge Instructions      Advised to continue the Flonase nasal spray, 2 sprays each nostril once daily to help decrease sinus congestion. Advised to take the Augmentin 875 mg every 12 hours with food to help treat sinus and respiratory infection. Advised take prednisone 10 mg 3 times a day until completed as this will help decrease the sinus and respiratory inflammatory congestion. Advised follow-up PCP or return to urgent care if symptoms fail to improve.    ED Prescriptions     Medication Sig Dispense Auth. Provider   predniSONE (DELTASONE) 10 MG tablet Take 1 tablet (10 mg total) by mouth 3 (three) times daily. 15 tablet Nyoka Lint, PA-C   amoxicillin-clavulanate (AUGMENTIN) 875-125 MG tablet Take 1 tablet by mouth every 12 (twelve) hours. 14 tablet Nyoka Lint, PA-C      PDMP not reviewed this encounter.   Nyoka Lint, PA-C 02/06/22 1036

## 2022-02-06 NOTE — ED Triage Notes (Signed)
Pt is present today with c/o cough, congestion, and sinus pressure. Pt sx started last Monday

## 2022-02-06 NOTE — Discharge Instructions (Addendum)
Advised to continue the Flonase nasal spray, 2 sprays each nostril once daily to help decrease sinus congestion. Advised to take the Augmentin 875 mg every 12 hours with food to help treat sinus and respiratory infection. Advised take prednisone 10 mg 3 times a day until completed as this will help decrease the sinus and respiratory inflammatory congestion. Advised follow-up PCP or return to urgent care if symptoms fail to improve.

## 2022-03-25 ENCOUNTER — Ambulatory Visit
Admission: EM | Admit: 2022-03-25 | Discharge: 2022-03-25 | Disposition: A | Discharge status: Home / Self Care | Payer: Medicare PPO | Attending: Physician Assistant | Admitting: Physician Assistant

## 2022-03-25 DIAGNOSIS — R062 Wheezing: Secondary | ICD-10-CM

## 2022-03-25 DIAGNOSIS — J209 Acute bronchitis, unspecified: Secondary | ICD-10-CM | POA: Diagnosis not present

## 2022-03-25 DIAGNOSIS — J069 Acute upper respiratory infection, unspecified: Secondary | ICD-10-CM

## 2022-03-25 MED ORDER — ALBUTEROL SULFATE HFA 108 (90 BASE) MCG/ACT IN AERS: 1.0000 | INHALATION_SPRAY | Freq: Four times a day (QID) | RESPIRATORY_TRACT | 0 refills | Status: DC | PRN

## 2022-03-25 MED ORDER — PREDNISONE 10 MG PO TABS: 10.0000 mg | ORAL_TABLET | Freq: Three times a day (TID) | ORAL | 0 refills | Status: DC

## 2022-03-25 MED ORDER — AMOXICILLIN-POT CLAVULANATE 875-125 MG PO TABS: 1.0000 | ORAL_TABLET | Freq: Two times a day (BID) | ORAL | 0 refills | Status: DC

## 2022-03-25 MED ORDER — HYDROCODONE BIT-HOMATROP MBR 5-1.5 MG/5ML PO SOLN: 5.0000 mL | Freq: Four times a day (QID) | ORAL | 0 refills | Status: DC | PRN

## 2022-03-25 NOTE — Discharge Instructions (Signed)
Advised to use albuterol inhaler, 2 puffs every 6 hours on a regular basis to help decrease cough and congestion. Advised to take the Augmentin every 12 hours with food to help treat the bronchial infection. Advised take prednisone 10 mg 3 times a day for 5 days only to help reduce the respiratory inflammatory component and reduce the wheezing. Advised take the Hycodan cough syrup, 1 to 2 teaspoons every 6-8 hours as needed for cough.  (Be careful with this medicine as it does cause drowsiness) Advised follow-up PCP or return to urgent care if symptoms fail to improve.

## 2022-03-25 NOTE — ED Provider Notes (Signed)
[facility]    CSN: [id number] Arrival date & time: 03/25/22  2563      History   Chief Complaint Chief Complaint  Patient presents with   Cough    HPI Miranda Willis is a 66 y.o. female.   66 year old female presents with cough and wheezing.  Patient decayed for the past week she has been having persistent upper respiratory congestion with sinus congestion and pressure mainly frontal maxillary, postnasal drip and rhinitis with mainly purulent production.  Patient also indicates she has been having a lot of coughing with chest congestion and purulent production being green and thick.  Patient indicates that she has been having shortness of breath and wheezing with the cough.  She indicates that she has been having coughing exacerbations that have been keeping her up at night and that the coughing makes her chest feel "raw" with the coughing.  Patient indicates she has not had any fever or chills, she does take some over-the-counter Mucinex which has not been improving her symptoms.  She is tolerating fluids well.   Cough Associated symptoms: rhinorrhea, shortness of breath and wheezing     Past Medical History:  Diagnosis Date   Complication of anesthesia    Hypertension    Murmur    aortic stenosis   Osteoarthritis    PONV (postoperative nausea and vomiting)     Patient Active Problem List   Diagnosis Date Noted   Aortic stenosis 03/18/2020   Trochanteric bursitis 03/18/2020   S/P vaginal hysterectomy 03/03/2019   Other social stressor 09/26/2018   Routine general medical examination at a health care facility 11/29/2014   Advance care planning 11/29/2014   Hypertriglyceridemia 10/09/2013   Hyperglycemia 10/09/2013   Shoulder pain, left 10/15/2010   Osteoarthritis 06/05/2010   ALLERGIC RHINITIS 01/02/2009   DEGENERATIVE JOINT DISEASE, 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   ROTATOR CUFF SYNDROME, RIGHT 01/03/2008   Essential hypertension 12/15/2007    Past  Surgical History:  Procedure Laterality Date   ABDOMINAL HYSTERECTOMY     ANTERIOR AND POSTERIOR REPAIR N/A 03/03/2019   Procedure: ANTERIOR (CYSTOCELE) AND POSTERIOR REPAIR (RECTOCELE);  Surgeon: Paula Compton, MD;  Location[facility];  Service: Gynecology;  Laterality: N/A;   [place] N/A 03/03/2019   Procedure: CYSTOSCOPY;  Surgeon: Paula Compton, MD;  Location: [facility];  Service: Gynecology;  Laterality: N/A;   CYSTOSCOPY WITH URETEROSCOPY AND STENT PLACEMENT Left 03/03/2019   Procedure: CYSTOSCOPY WITH URETEROSCOPY AND STENT PLACEMENT;  Surgeon: Paula Compton, MD;  Location: [facility];  Service: Gynecology;  Laterality: Left;   CYSTOSCOPY WITH URETEROSCOPY AND STENT PLACEMENT Left 03/03/2019   Procedure: CYSTOSCOPY WITH URETEROSCOPY AND STENT PLACEMENT;  Surgeon: Cleon Gustin, MD;  Location: [facility];  Service: Urology;  Laterality: Left;   DILATION AND CURETTAGE OF UTERUS  1985   due to miscarriage   EYE SURGERY Bilateral 2016   NOSE SURGERY     SPINE SURGERY  06/1996   back surgery L5-S1 Dr Jenean Lindau   VAGINAL HYSTERECTOMY N/A 03/03/2019   Procedure: HYSTERECTOMY VAGINAL;  Surgeon: Paula Compton, MD;  Location: [facility];  Service: Gynecology;  Laterality: N/A;    OB History   No obstetric history on file.      Home Medications    Prior to Admission medications   Medication Sig Start Date End Date Taking? Authorizing Provider  albuterol (VENTOLIN HFA) 108 (90 Base) MCG/ACT inhaler Inhale  1-2 puffs into the lungs every 6 (six) hours as needed for wheezing or shortness of breath. 03/25/22  Yes Nyoka Lint, PA-C  etodolac (LODINE) 400 MG tablet TAKE 1 TABLET (400 MG TOTAL) BY MOUTH 2 (TWO) TIMES DAILY AS NEEDED (FOR PAIN. TAKE WITH FOOD.). 11/12/21  Yes Tonia Ghent, MD  fluticasone [facility]) 50 MCG/ACT nasal spray Place 2 sprays into both  nostrils daily. 01/18/19  Yes Tonia Ghent, MD  HYDROcodone bit-homatropine (HYCODAN) 5-1.5 MG/5ML syrup Take 5 mLs by mouth every 6 (six) hours as needed for cough. 03/25/22  Yes Nyoka Lint, PA-C  hydrOXYzine (ATARAX) 25 MG tablet Take 1 tablet (25 mg total) by mouth daily as needed. 11/12/21  Yes Tonia Ghent, MD  lisinopril (ZESTRIL) 10 MG tablet Take 1 tablet (10 mg total) by mouth daily. 11/12/21  Yes Tonia Ghent, MD  amoxicillin-clavulanate (AUGMENTIN) 875-125 MG tablet Take 1 tablet by mouth every 12 (twelve) hours. 03/25/22   Nyoka Lint, PA-C  loratadine (CLARITIN) 10 MG tablet TAKE 1 TABLET BY MOUTH EVERY DAY AS NEEDED 11/10/21   Tonia Ghent, MD  ondansetron (ZOFRAN) 4 MG tablet Take 1 tablet (4 mg total) by mouth every 8 (eight) hours as needed for nausea or vomiting. 11/12/21   Tonia Ghent, MD  predniSONE (DELTASONE) 10 MG tablet Take 1 tablet (10 mg total) by mouth 3 (three) times daily. 03/25/22   Nyoka Lint, PA-C    Family History Family History  Problem Relation Age of Onset   Heart disease Mother        MI   Depression Mother    Heart disease Father        MI   Depression Father    Heart disease Maternal Grandmother        MI and CAD   Depression Paternal Grandfather    Colon cancer Neg Hx    Breast cancer Neg Hx     Social History Social History   Tobacco Use   Smoking status: Never   Smokeless tobacco: Never  Vaping Use   Vaping Use: Never used  Substance Use Topics   Alcohol use: No    Alcohol/week: 0.0 standard drinks of alcohol   Drug use: No     Allergies   Patient has no known allergies.   Review of Systems Review of Systems  HENT:  Positive for postnasal drip, rhinorrhea, sinus pressure and sinus pain.   Respiratory:  Positive for cough, shortness of breath and wheezing.      Physical Exam Triage Vital Signs ED Triage Vitals  Enc Vitals Group     BP 03/25/22 1101 (!) 155/77     Pulse Rate 03/25/22 1101 76     Resp  03/25/22 1101 18     Temp 03/25/22 1101 98.7 F (37.1 C)     Temp Source 03/25/22 1101 Oral     SpO2 03/25/22 1101 95 %     Weight --      Height --      Head Circumference --      Peak Flow --      Pain Score 03/25/22 1058 5     Pain Loc --      Pain Edu? --      Excl. in [place]? --    No data found.  Updated Vital Signs BP (!) 155/77 (BP Location: Left Arm)   Pulse 76   Temp 98.7 F (37.1 C) (Oral)   Resp 18  SpO2 95%   Visual Acuity Right Eye Distance:   Left Eye Distance:   Bilateral Distance:    Right Eye Near:   Left Eye Near:    Bilateral Near:     Physical Exam Constitutional:      Appearance: Normal appearance.  HENT:     Right Ear: Ear canal normal. Tympanic membrane is injected.     Left Ear: Ear canal normal. Tympanic membrane is injected.     Mouth/Throat:     Mouth: Mucous membranes are moist.     Pharynx: Oropharynx is clear. No posterior oropharyngeal erythema.  Cardiovascular:     Rate and Rhythm: Normal rate and regular rhythm.     Heart sounds: Normal heart sounds.  Pulmonary:     Effort: Pulmonary effort is normal.     Breath sounds: Normal air entry. Examination of the right-upper field reveals wheezing and rhonchi. Examination of the left-upper field reveals wheezing and rhonchi. Wheezing (mild) and rhonchi (mild) present. No rales.  Lymphadenopathy:     Cervical: No cervical adenopathy.  Neurological:     Mental Status: She is alert.      UC Treatments / Results  Labs (all labs ordered are listed, but only abnormal results are displayed) Labs Reviewed - No data to display  EKG   Radiology No results found.  Procedures Procedures (including critical care time)  Medications Ordered in UC Medications - No data to display  Initial Impression / Assessment and Plan / UC Course  I have reviewed the triage vital signs and the nursing notes.  Pertinent labs & imaging results that were available during my care of the patient were  reviewed by me and considered in my medical decision making (see chart for details).    Plan: The acute upper respiratory infection will be treated with the following: A.  Hycodan cough syrup, 1 to 2 teaspoons every 6-8 hours as needed for cough and chest congestion. 2.  The acute bronchitis will be treated with the following: A.  Augmentin 875 mg every 12 hours with food to treat bronchial and respiratory infection. 3.  The wheezing will be treated with the following: A.  Prednisone 10 mg, every 8 hours until completed.  (5 days only) B.  Albuterol inhaler, 2 puffs every 6 hours to help decrease wheezing and cough. 4.  Patient advised follow-up PCP or return to urgent care if symptoms fail to improve. Final Clinical Impressions(s) / UC Diagnoses   Final diagnoses:  Acute upper respiratory infection  Acute bronchitis, unspecified organism  Wheezing     Discharge Instructions      Advised to use albuterol inhaler, 2 puffs every 6 hours on a regular basis to help decrease cough and congestion. Advised to take the Augmentin every 12 hours with food to help treat the bronchial infection. Advised take prednisone 10 mg 3 times a day for 5 days only to help reduce the respiratory inflammatory component and reduce the wheezing. Advised take the Hycodan cough syrup, 1 to 2 teaspoons every 6-8 hours as needed for cough.  (Be careful with this medicine as it does cause drowsiness) Advised follow-up PCP or return to urgent care if symptoms fail to improve.    ED Prescriptions     Medication Sig Dispense Auth. Provider   amoxicillin-clavulanate (AUGMENTIN) 875-125 MG tablet Take 1 tablet by mouth every 12 (twelve) hours. 14 tablet Nyoka Lint, PA-C   predniSONE (DELTASONE) 10 MG tablet Take 1 tablet (10 mg total) by  mouth 3 (three) times daily. 15 tablet Nyoka Lint, PA-C   albuterol (VENTOLIN HFA) 108 (90 Base) MCG/ACT inhaler Inhale 1-2 puffs into the lungs every 6 (six) hours as needed  for wheezing or shortness of breath. 8 g Nyoka Lint, PA-C   HYDROcodone bit-homatropine [facility]) 5-1.5 MG/5ML syrup Take 5 mLs by mouth every 6 (six) hours as needed for cough. 120 mL Nyoka Lint, PA-C      I have reviewed the PDMP during this encounter.   Nyoka Lint, PA-C 03/25/22 1145

## 2022-03-25 NOTE — ED Triage Notes (Signed)
Pt with coughing, "bronchial burning", HA, fevered x 1week.

## 2022-04-14 ENCOUNTER — Telehealth: Payer: Self-pay | Admitting: Family Medicine

## 2022-04-14 DIAGNOSIS — R011 Cardiac murmur, unspecified: Secondary | ICD-10-CM

## 2022-04-14 NOTE — Telephone Encounter (Signed)
Please check with patient and see if she wants to go see the GI clinic regarding a colonoscopy.  Either way let me know.  I put in the order for follow-up echo.  She should get a call about scheduling.  Thanks.

## 2022-04-16 NOTE — Telephone Encounter (Signed)
Unable to reach patient. Unable to leave voicemail.  

## 2022-04-19 NOTE — Telephone Encounter (Signed)
Please send her a letter about this message and close the note.  Thanks.

## 2022-04-22 NOTE — Telephone Encounter (Signed)
Letter sent through mychart and printed and placed in [place] mail to patient.

## 2022-04-23 ENCOUNTER — Telehealth: Payer: Medicare PPO | Admitting: Nurse Practitioner

## 2022-04-23 ENCOUNTER — Encounter: Payer: Self-pay | Admitting: Family Medicine

## 2022-04-23 ENCOUNTER — Ambulatory Visit: Payer: Medicare PPO | Admitting: Family Medicine

## 2022-04-23 ENCOUNTER — Telehealth: Payer: Self-pay | Admitting: Family Medicine

## 2022-04-23 VITALS — BP 160/80 | HR 80 | Temp 98.4°F | Ht 64.0 in | Wt 167.0 lb

## 2022-04-23 DIAGNOSIS — R42 Dizziness and giddiness: Secondary | ICD-10-CM | POA: Insufficient documentation

## 2022-04-23 DIAGNOSIS — I10 Essential (primary) hypertension: Secondary | ICD-10-CM

## 2022-04-23 MED ORDER — ONDANSETRON HCL 4 MG PO TABS: 4.0000 mg | ORAL_TABLET | Freq: Three times a day (TID) | ORAL | 0 refills | Status: DC | PRN

## 2022-04-23 MED ORDER — MECLIZINE HCL 12.5 MG PO TABS: 12.5000 mg | ORAL_TABLET | Freq: Three times a day (TID) | ORAL | 0 refills | Status: DC | PRN

## 2022-04-23 NOTE — Patient Instructions (Addendum)
Nice to meet you. I have provided meclizine for you to help with your vertigo.  Please monitor for drowsiness while taking this.  You can also take the Zofran to help with nausea. If your vertigo worsens again or if you have persistent vertigo or if you develop any other neurological issues please go to the emergency department for further evaluation.  How to Perform the Epley Maneuver The Epley maneuver is an exercise that relieves symptoms of vertigo. Vertigo is the feeling that you or your surroundings are moving when they are not. When you feel vertigo, you may feel like the room is spinning and may have trouble walking. The Epley maneuver is used for a type of vertigo caused by a calcium deposit in a part of the inner ear. The maneuver involves changing head positions to help the deposit move out of the area. You can do this maneuver at home whenever you have symptoms of vertigo. You can repeat it in 24 hours if your vertigo has not gone away. Even though the Epley maneuver may relieve your vertigo for a few weeks, it is possible that your symptoms will return. This maneuver relieves vertigo, but it does not relieve dizziness. What are the risks? If it is done correctly, the Epley maneuver is considered safe. Sometimes it can lead to dizziness or nausea that goes away after a short time. If you develop other symptoms--such as changes in vision, weakness, or numbness--stop doing the maneuver and call your health care provider. Supplies needed: A bed or table. A pillow. How to do the Epley maneuver     Sit on the edge of a bed or table with your back straight and your legs extended or hanging over the edge of the bed or table. Turn your head halfway toward the affected ear or side as told by your health care provider. Lie backward quickly with your head turned until you are lying flat on your back. Your head should dangle (head-hanging position). You may want to position a pillow under your  shoulders. Hold this position for at least 30 seconds. If you feel dizzy or have symptoms of vertigo, continue to hold the position until the symptoms stop. Turn your head to the opposite direction until your unaffected ear is facing down. Your head should continue to dangle. Hold this position for at least 30 seconds. If you feel dizzy or have symptoms of vertigo, continue to hold the position until the symptoms stop. Turn your whole body to the same side as your head so that you are positioned on your side. Your head will now be nearly facedown and no longer needs to dangle. Hold for at least 30 seconds. If you feel dizzy or have symptoms of vertigo, continue to hold the position until the symptoms stop. Sit back up. You can repeat the maneuver in 24 hours if your vertigo does not go away. Follow these instructions at home: For 24 hours after doing the Epley maneuver: Keep your head in an upright position. When lying down to sleep or rest, keep your head raised (elevated) with two or more pillows. Avoid excessive neck movements. Activity Do not drive or use machinery if you feel dizzy. After doing the Epley maneuver, return to your normal activities as told by your health care provider. Ask your health care provider what activities are safe for you. General instructions Drink enough fluid to keep your urine pale yellow. Do not drink alcohol. Take over-the-counter and prescription medicines only as  told by your health care provider. Keep all follow-up visits. This is important. Preventing vertigo symptoms Ask your health care provider if there is anything you should do at home to prevent vertigo. He or she may recommend that you: Keep your head elevated with two or more pillows while you sleep. Do not sleep on the side of your affected ear. Get up slowly from bed. Avoid sudden movements during the day. Avoid extreme head positions or movement, such as looking up or bending over. Contact a  health care provider if: Your vertigo gets worse. You have other symptoms, including: Nausea. Vomiting. Headache. Get help right away if you: Have vision changes. Have a headache or neck pain that is severe or getting worse. Cannot stop vomiting. Have new numbness or weakness in any part of your body. These symptoms may represent a serious problem that is an emergency. Do not wait to see if the symptoms will go away. Get medical help right away. Call your local emergency services (911 in the [place] Do not drive yourself to the hospital. Summary Vertigo is the feeling that you or your surroundings are moving when they are not. The Epley maneuver is an exercise that relieves symptoms of vertigo. If the Epley maneuver is done correctly, it is considered safe. This information is not intended to replace advice given to you by your health care provider. Make sure you discuss any questions you have with your health care provider. Document Revised: 02/29/2020 Document Reviewed: 02/29/2020 Elsevier Patient Education  [place].

## 2022-04-23 NOTE — Assessment & Plan Note (Addendum)
BP elevated today.  Likely in the setting of her vertigo and not feeling well.  Patient reports historically her blood pressure has gone up when she has been sick.  I encouraged her to follow-up with her PCPs office next week.

## 2022-04-23 NOTE — Assessment & Plan Note (Signed)
Suspect BPPV though also discussed that this could represent vestibular neuritis.  Symptoms are consistent with her prior vertigo episodes per patient report.  Symptoms have been improving.  Neurologically she is intact.  Discussed a trial of meclizine and Zofran for nausea.  She will also do modified Epley maneuvers at home.  Discussed if she had any recurrence of her vertigo or if it became persistent she would need to seek medical attention in the emergency department for possible imaging.

## 2022-04-23 NOTE — Telephone Encounter (Signed)
Patient's husband called office and relayed to front staff he was very upset we "would not see patient today". Could not speak with husband as no DPR on file. Spoke with wife, on speaker phone with husband, and reminded her we had offered her several appt options and she had refused them all, including times for today. She said she would like to be seen instead of going elsewhere. Reviewed available appts with front staff, she has been scheduled to see Dr. Caryl Bis this afternoon. Patient aware this is at [facility] and address provided. Nothing further needed at this time.

## 2022-04-23 NOTE — Telephone Encounter (Signed)
Previously attempted to contact patient via M/H# in chart x3, recording states "unable to complete your call at this time". Patient was too late to complete video visit as scheduled, and provider requested patient be seen in person due to complaint and necessary exam/evaluation for symptoms.   Provider aware of outcome. Nothing further needed at this time.

## 2022-04-23 NOTE — Progress Notes (Signed)
Tommi Rumps, MD Phone: [phone]  Miranda Willis is a 67 y.o. female who presents today for same day visit.   Vertigo: Patient notes onset of symptoms this morning when she got up out of bed.  She had spinning of the room around her.  She notes that it was worse when she tried to lay down on her right side.  She notes a history of vertigo with similar symptoms.  She has had some nausea and some headache with this.  She notes the vertigo symptoms are significantly worse with movement of her head.  She notes it has progressively improved throughout the day.  She notes no hearing changes or tinnitus.  She notes a little bit of fullness in her right ear.  He does note some chronic issues in her right shoulder and has had some trapezius tightness recently as well.  She notes in the past she was given meclizine and Zofran to help with her symptoms.  Social History   Tobacco Use  Smoking Status Never  Smokeless Tobacco Never    Current Outpatient Medications on File Prior to Visit  Medication Sig Dispense Refill   etodolac (LODINE) 400 MG tablet TAKE 1 TABLET (400 MG TOTAL) BY MOUTH 2 (TWO) TIMES DAILY AS NEEDED (FOR PAIN. TAKE WITH FOOD.). 180 tablet 3   fluticasone (FLONASE) 50 MCG/ACT nasal spray Place 2 sprays into both nostrils daily. 16 g 12   hydrOXYzine (ATARAX) 25 MG tablet Take 1 tablet (25 mg total) by mouth daily as needed. 90 tablet 3   lisinopril (ZESTRIL) 10 MG tablet Take 1 tablet (10 mg total) by mouth daily. 90 tablet 3   loratadine (CLARITIN) 10 MG tablet TAKE 1 TABLET BY MOUTH EVERY DAY AS NEEDED 90 tablet 3   No current facility-administered medications on file prior to visit.     ROS see history of present illness  Objective  Physical Exam Vitals:   04/23/22 1521 04/23/22 1537  BP: (!) 150/70 (!) 160/80  Pulse: 80   Temp: 98.4 F (36.9 C)   SpO2: 96%     BP Readings from Last 3 Encounters:  04/23/22 (!) 160/80  03/25/22 (!) 155/77  02/06/22 (!)  171/72   Wt Readings from Last 3 Encounters:  04/23/22 167 lb (75.8 kg)  11/12/21 161 lb (73 kg)  03/15/20 173 lb 12.8 oz (78.8 kg)    Physical Exam Constitutional:      General: She is not in acute distress.    Appearance: She is not diaphoretic.  HENT:     Right Ear: Tympanic membrane normal.     Left Ear: Tympanic membrane normal.  Cardiovascular:     Rate and Rhythm: Normal rate and regular rhythm.     Heart sounds: Normal heart sounds.     Comments: No carotid bruits Pulmonary:     Effort: Pulmonary effort is normal.     Breath sounds: Normal breath sounds.  Musculoskeletal:     Comments: Slight tenderness over her mid right trapezius muscle  Skin:    General: Skin is warm and dry.  Neurological:     Mental Status: She is alert.     Comments: CN 3-12 intact, 5/5 strength in bilateral biceps, triceps, grip, quads, hamstrings, plantar and dorsiflexion, sensation to light touch intact in bilateral UE and LE, normal gait, normal rapid alternating movements, normal finger-nose, patient feels nauseous on [place] though did not have any nystagmus      Assessment/Plan: Please see individual problem list.  Vertigo Assessment & Plan: Suspect BPPV though also discussed that this could represent vestibular neuritis.  Symptoms are consistent with her prior vertigo episodes per patient report.  Symptoms have been improving.  Neurologically she is intact.  Discussed a trial of meclizine and Zofran for nausea.  She will also do modified Epley maneuvers at home.  Discussed if she had any recurrence of her vertigo or if it became persistent she would need to seek medical attention in the emergency department for possible imaging.  Orders: -     Meclizine HCl; Take 1 tablet (12.5 mg total) by mouth 3 (three) times daily as needed for dizziness.  Dispense: 30 tablet; Refill: 0 -     Ondansetron HCl; Take 1 tablet (4 mg total) by mouth every 8 (eight) hours as needed for nausea or  vomiting.  Dispense: 20 tablet; Refill: 0  Essential hypertension Assessment & Plan: BP elevated today.  Likely in the setting of her vertigo and not feeling well.  Patient reports historically her blood pressure has gone up when she has been sick.  I encouraged her to follow-up with her PCPs office next week.      Return in about 1 week (around 04/30/2022) for With PCP office for follow-up on vertigo and blood pressure.   Tommi Rumps, MD [place]

## 2022-04-23 NOTE — Telephone Encounter (Signed)
Pt called stating she was scheduled to have a Mining engineer with [facility] today, 1/10 @ 9:20am. Pt stated she missed the call from the nurse due to her phone being on dnd. I spoke to [facility] she stated pt would just need to log onto mychart to start visit. Pt stated she didn't know her mychart login info to get onto visit & she thought he could just be done over the telephone. Sheena spoke to [facility], he stated he'd rather see pt in person vs virtually. I offered to reschedule pt with [facility]'s first available day, which was tomorrow, 1/11. Pt declined waiting til tomorrow or rescheduling with another provider today. Pt stated she'd go elsewhere for issue. Call back # [phone]

## 2022-05-26 ENCOUNTER — Encounter: Payer: Self-pay | Admitting: Nurse Practitioner

## 2022-05-26 ENCOUNTER — Ambulatory Visit: Payer: Medicare PPO | Admitting: Nurse Practitioner

## 2022-05-26 VITALS — BP 144/62 | HR 70 | Temp 98.6°F | Resp 16 | Ht 64.0 in | Wt 168.4 lb

## 2022-05-26 DIAGNOSIS — I10 Essential (primary) hypertension: Secondary | ICD-10-CM | POA: Diagnosis not present

## 2022-05-26 DIAGNOSIS — J0110 Acute frontal sinusitis, unspecified: Secondary | ICD-10-CM

## 2022-05-26 MED ORDER — AMOXICILLIN-POT CLAVULANATE 875-125 MG PO TABS: 1.0000 | ORAL_TABLET | Freq: Two times a day (BID) | ORAL | 0 refills | Status: AC

## 2022-05-26 NOTE — Assessment & Plan Note (Signed)
Patient's blood pressure elevated at beginning of office visit some better at recheck.  Patient is on lisinopril 10 mg states she is taking medication today past 3 blood pressures have been elevated will send PCP a note for follow-up

## 2022-05-26 NOTE — Patient Instructions (Signed)
Nice to see you today I have sent in an antibiotic to he pharmacy Continue taking the Claritin and the Flonase.  Follow up if no improvement

## 2022-05-26 NOTE — Assessment & Plan Note (Signed)
Given physical exam and length of illness will elect to treat with Augmentin 875-125 mg twice daily for 7 days.  Follow-up if no improvement.  At the end of visit patient did request hydrocodone cough medication and declined as she is not coughing that often.  Did not cough any during office visit.  Did recommend patient using Robitussin or Delsym over-the-counter as needed

## 2022-05-26 NOTE — Progress Notes (Signed)
Acute Office Visit  Subjective:     Patient ID: Miranda Willis, female    DOB: 21-Jul-1955, 67 y.o.   MRN: [id number]  Chief Complaint  Patient presents with   Sinus Problem    X 9 days with no improvement.    Headache    X 1 week     Patient is in today for sick symptoms with a history of htn, aortic stenosis, OA, hysterectomy, HLD  Symptoms started approx 9 days ago States grand kids got sick after her.  Covid vaccine: original series Covid test approx 6 days ago that was negative  Flu vaccine: not up to date Has been using mucinex, tylenol, Flonase, coricidin that has helped some   Review of Systems  Constitutional:  Positive for fever and malaise/fatigue. Negative for chills.       Appetite decreased Drinking plenty of fluid   HENT:  Positive for congestion and sore throat (in the afternoons). Negative for ear discharge and ear pain.   Respiratory:  Positive for cough and sputum production. Negative for shortness of breath.   Gastrointestinal:  Negative for abdominal pain, diarrhea (had diarrhea for a couple days), nausea and vomiting.  Neurological:  Positive for headaches.        Objective:    BP (!) 144/62   Pulse 70   Temp 98.6 F (37 C)   Resp 16   Ht 5' 4"$  (1.626 m)   Wt 168 lb 6 oz (76.4 kg)   SpO2 97%   BMI 28.90 kg/m  BP Readings from Last 3 Encounters:  05/26/22 (!) 144/62  04/23/22 (!) 160/80  03/25/22 (!) 155/77   Wt Readings from Last 3 Encounters:  05/26/22 168 lb 6 oz (76.4 kg)  04/23/22 167 lb (75.8 kg)  11/12/21 161 lb (73 kg)      Physical Exam Vitals and nursing note reviewed.  Constitutional:      Appearance: Normal appearance.  HENT:     Right Ear: Tympanic membrane, ear canal and external ear normal.     Left Ear: Tympanic membrane, ear canal and external ear normal.     Nose:     Right Sinus: Frontal sinus tenderness present. No maxillary sinus tenderness.     Left Sinus: Frontal sinus tenderness present. No  maxillary sinus tenderness.     Mouth/Throat:     Mouth: Mucous membranes are moist.     Pharynx: Oropharynx is clear.  Cardiovascular:     Rate and Rhythm: Normal rate and regular rhythm.     Heart sounds: Normal heart sounds.  Pulmonary:     Effort: Pulmonary effort is normal.     Breath sounds: Normal breath sounds.  Abdominal:     General: Bowel sounds are normal.  Lymphadenopathy:     Cervical: No cervical adenopathy.  Neurological:     Mental Status: She is alert.     No results found for any visits on 05/26/22.      Assessment & Plan:   Problem List Items Addressed This Visit       Cardiovascular and Mediastinum   Essential hypertension    Patient's blood pressure elevated at beginning of office visit some better at recheck.  Patient is on lisinopril 10 mg states she is taking medication today past 3 blood pressures have been elevated will send PCP a note for follow-up        Respiratory   Acute non-recurrent frontal sinusitis - Primary    Given physical  exam and length of illness will elect to treat with Augmentin 875-125 mg twice daily for 7 days.  Follow-up if no improvement.  At the end of visit patient did request hydrocodone cough medication and declined as she is not coughing that often.  Did not cough any during office visit.  Did recommend patient using Robitussin or Delsym over-the-counter as needed      Relevant Medications   amoxicillin-clavulanate (AUGMENTIN) 875-125 MG tablet    Meds ordered this encounter  Medications   amoxicillin-clavulanate (AUGMENTIN) 875-125 MG tablet    Sig: Take 1 tablet by mouth 2 (two) times daily for 7 days.    Dispense:  14 tablet    Refill:  0    Order Specific Question:   Supervising Provider    Answer:   TOWER, MARNE A [[id number]    Return if symptoms worsen or fail to improve.  Romilda Garret, NP

## 2022-06-01 ENCOUNTER — Telehealth: Payer: Self-pay | Admitting: Family Medicine

## 2022-06-01 NOTE — Telephone Encounter (Signed)
Please have patient check blood pressure at home when she is feeling well.  If still above 140/90 then please schedule office visit.  Thanks.

## 2022-06-02 NOTE — Telephone Encounter (Signed)
Tried to call patient but it just rings and rings and then says call could not be completed at this time.

## 2022-07-08 ENCOUNTER — Telehealth: Payer: Self-pay | Admitting: Family Medicine

## 2022-07-08 NOTE — Telephone Encounter (Signed)
Lauren from [facility] called over and stated that a pre-auth is needed for echocardiogram patient is having tomorrow morning. She stated to just leave a note in the appt notes when it is complete. Thank you!

## 2022-07-09 ENCOUNTER — Ambulatory Visit: Payer: Medicare PPO | Attending: Family Medicine

## 2022-07-09 DIAGNOSIS — R011 Cardiac murmur, unspecified: Secondary | ICD-10-CM | POA: Diagnosis not present

## 2022-07-09 LAB — ECHOCARDIOGRAM COMPLETE
AR max vel: 1.65 cm2
AV Area VTI: 1.8 cm2
AV Area mean vel: 1.69 cm2
AV Mean grad: 12 mmHg
AV Peak grad: 22.3 mmHg
Ao pk vel: 2.36 m/s
Area-P 1/2: 2.73 cm2
S' Lateral: 2.5 cm
Single Plane A4C EF: 59 %

## 2022-07-09 MED ORDER — PERFLUTREN LIPID MICROSPHERE
1.0000 mL | INTRAVENOUS | Status: AC | PRN
  Administered 2022-07-09: 2 mL via INTRAVENOUS

## 2022-09-02 ENCOUNTER — Other Ambulatory Visit: Payer: Self-pay

## 2022-09-02 ENCOUNTER — Ambulatory Visit
Admission: EM | Admit: 2022-09-02 | Discharge: 2022-09-02 | Disposition: A | Discharge status: Home / Self Care | Payer: Medicare PPO | Attending: Internal Medicine | Admitting: Internal Medicine

## 2022-09-02 ENCOUNTER — Encounter: Payer: Self-pay | Admitting: Emergency Medicine

## 2022-09-02 DIAGNOSIS — M2550 Pain in unspecified joint: Secondary | ICD-10-CM

## 2022-09-02 DIAGNOSIS — M25532 Pain in left wrist: Secondary | ICD-10-CM | POA: Diagnosis not present

## 2022-09-02 MED ORDER — METHYLPREDNISOLONE 4 MG PO TBPK: ORAL_TABLET | ORAL | 0 refills | Status: DC

## 2022-09-02 NOTE — ED Provider Notes (Signed)
[facility]    CSN: [id number] Arrival date & time: 09/02/22  4098      History   Chief Complaint Chief Complaint  Patient presents with   Joint Pain    HPI Miranda Willis is a 67 y.o. female.   Patient presents to urgent care for evaluation of left wrist pain and swelling that started last night. No recent trauma/injuries to the left wrist/hand. Family history of gout, no personal history of gout. Takes etodolac 10mg  once daily when she wakes up in the mornings to help with generalized pain associated with osteoarthritis. This usually helps her get through the day without too much pain but states this has not been helping much lately. Reports chills last night without fever. No redness/warmth to the left wrist or laceration/abrasion to the wrist. Pain to the left wrist is generalized and currently a 5 on a scale of 0-10 and intermittently radiates proximally to the left forearm towards the lateral left elbow.  Pain is worse with movement and she describes as an achy stiffness.  Non-smoker, denies drug use. States "everything hurts".  States her mother had severe rheumatoid arthritis but she has never been worked up for this or diagnosed with this in the past.  Last night when she first noticed the swelling and pain to the left wrist, she took 400 mg of ibuprofen and applied ice to the left wrist which helped a little bit with the pain.      Past Medical History:  Diagnosis Date   Complication of anesthesia    Hypertension    Murmur    aortic stenosis   Osteoarthritis    PONV (postoperative nausea and vomiting)     Patient Active Problem List   Diagnosis Date Noted   Vertigo 04/23/2022   Aortic stenosis 03/18/2020   Trochanteric bursitis 03/18/2020   S/P vaginal hysterectomy 03/03/2019   Other social stressor 09/26/2018   Routine general medical examination at a health care facility 11/29/2014   Advance care planning 11/29/2014   Acute non-recurrent  frontal sinusitis 11/18/2013   Hypertriglyceridemia 10/09/2013   Hyperglycemia 10/09/2013   Shoulder pain, left 10/15/2010   Osteoarthritis 06/05/2010   ALLERGIC RHINITIS 01/02/2009   DEGENERATIVE JOINT DISEASE, 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   ROTATOR CUFF SYNDROME, RIGHT 01/03/2008   Essential hypertension 12/15/2007    Past Surgical History:  Procedure Laterality Date   ABDOMINAL HYSTERECTOMY     ANTERIOR AND POSTERIOR REPAIR N/A 03/03/2019   Procedure: ANTERIOR (CYSTOCELE) AND POSTERIOR REPAIR (RECTOCELE);  Surgeon: Huel Cote, MD;  Location: [facility];  Service: Gynecology;  Laterality: N/A;   BACK SURGERY  1997   CYSTOSCOPY N/A 03/03/2019   Procedure: CYSTOSCOPY;  Surgeon: Huel Cote, MD;  Location: [facility];  Service: Gynecology;  Laterality: N/A;   CYSTOSCOPY WITH URETEROSCOPY AND STENT PLACEMENT Left 03/03/2019   Procedure: CYSTOSCOPY WITH URETEROSCOPY AND STENT PLACEMENT;  Surgeon: Huel Cote, MD;  Location: [facility];  Service: Gynecology;  Laterality: Left;   CYSTOSCOPY WITH URETEROSCOPY AND STENT PLACEMENT Left 03/03/2019   Procedure: CYSTOSCOPY WITH URETEROSCOPY AND STENT PLACEMENT;  Surgeon: Malen Gauze, MD;  Location: [facility];  Service: Urology;  Laterality: Left;   DILATION AND CURETTAGE OF UTERUS  1985   due to miscarriage   EYE SURGERY Bilateral 2016   NOSE SURGERY     SPINE SURGERY  06/1996   back surgery L5-S1 Dr Criss Alvine   VAGINAL HYSTERECTOMY N/A 03/03/2019  Procedure: HYSTERECTOMY VAGINAL;  Surgeon: Huel Cote, MD;  Location: [facility];  Service: Gynecology;  Laterality: N/A;    OB History   No obstetric history on file.      Home Medications    Prior to Admission medications   Medication Sig Start Date End Date Taking? Authorizing Provider  methylPREDNISolone (MEDROL DOSEPAK) 4 MG TBPK tablet Take as directed on box. 09/02/22  Yes  Georgia Delsignore, Donavan Burnet, FNP  etodolac (LODINE) 400 MG tablet TAKE 1 TABLET (400 MG TOTAL) BY MOUTH 2 (TWO) TIMES DAILY AS NEEDED (FOR PAIN. TAKE WITH FOOD.). 11/12/21   Joaquim Nam, MD  fluticasone [facility]) 50 MCG/ACT nasal spray Place 2 sprays into both nostrils daily. 01/18/19   Joaquim Nam, MD  hydrOXYzine (ATARAX) 25 MG tablet Take 1 tablet (25 mg total) by mouth daily as needed. 11/12/21   Joaquim Nam, MD  lisinopril (ZESTRIL) 10 MG tablet Take 1 tablet (10 mg total) by mouth daily. 11/12/21   Joaquim Nam, MD  loratadine (CLARITIN) 10 MG tablet TAKE 1 TABLET BY MOUTH EVERY DAY AS NEEDED 11/10/21   Joaquim Nam, MD  meclizine (ANTIVERT) 12.5 MG tablet Take 1 tablet (12.5 mg total) by mouth 3 (three) times daily as needed for dizziness. 04/23/22   Glori Luis, MD  ondansetron (ZOFRAN) 4 MG tablet Take 1 tablet (4 mg total) by mouth every 8 (eight) hours as needed for nausea or vomiting. Patient not taking: Reported on 05/26/2022 04/23/22   Glori Luis, MD    Family History Family History  Problem Relation Age of Onset   Heart disease Mother        MI   Depression Mother    Heart disease Father        MI   Depression Father    Heart disease Maternal Grandmother        MI and CAD   Depression Paternal Grandfather    Colon cancer Neg Hx    Breast cancer Neg Hx     Social History Social History   Tobacco Use   Smoking status: Never   Smokeless tobacco: Never  Vaping Use   Vaping Use: Never used  Substance Use Topics   Alcohol use: No    Alcohol/week: 0.0 standard drinks of alcohol   Drug use: No     Allergies   Patient has no known allergies.   Review of Systems Review of Systems Per HPI  Physical Exam Triage Vital Signs ED Triage Vitals  Enc Vitals Group     BP 09/02/22 0957 (!) 172/69     Pulse Rate 09/02/22 0957 68     Resp 09/02/22 0957 18     Temp 09/02/22 0957 98.4 F (36.9 C)     Temp Source 09/02/22 0957 Oral     SpO2  09/02/22 0957 95 %     Weight --      Height --      Head Circumference --      Peak Flow --      Pain Score 09/02/22 0958 5     Pain Loc --      Pain Edu? --      Excl. in GC? --    No data found.  Updated Vital Signs BP (!) 172/69 (BP Location: Left Arm)   Pulse 68   Temp 98.4 F (36.9 C) (Oral)   Resp 18   SpO2 95%   Visual Acuity Right Eye  Distance:   Left Eye Distance:   Bilateral Distance:    Right Eye Near:   Left Eye Near:    Bilateral Near:     Physical Exam Vitals and nursing note reviewed.  Constitutional:      Appearance: She is not ill-appearing or toxic-appearing.  HENT:     Head: Normocephalic and atraumatic.     Right Ear: Hearing and external ear normal.     Left Ear: Hearing and external ear normal.     Nose: Nose normal.     Mouth/Throat:     Lips: Pink.  Eyes:     General: Lids are normal. Vision grossly intact. Gaze aligned appropriately.     Extraocular Movements: Extraocular movements intact.     Conjunctiva/sclera: Conjunctivae normal.  Pulmonary:     Effort: Pulmonary effort is normal.  Musculoskeletal:     Right wrist: Normal.     Left wrist: Swelling (Mild swelling of the diffuse left wrist) and tenderness (Generalized tenderness to palpation of the left wrist) present. No deformity, effusion, lacerations, snuff box tenderness or crepitus. Normal range of motion. Normal pulse (+2 left radial pulse).     Right hand: Normal.     Left hand: Normal.     Cervical back: Neck supple.     Comments: No signs of trauma/injuries to the left wrist.  Pain to the left wrist is elicited with grip motion of the left hand and with Phalen test. Phalen test does not cause radicular pain to the left forearm.  Strength and sensation intact distally to left wrist pain.  No warmth, erythema, or overlying rash to skin of left wrist.  Skin:    General: Skin is warm and dry.     Capillary Refill: Capillary refill takes less than 2 seconds.     Findings: No  rash.  Neurological:     General: No focal deficit present.     Mental Status: She is alert and oriented to person, place, and time. Mental status is at baseline.     Cranial Nerves: No dysarthria or facial asymmetry.  Psychiatric:        Mood and Affect: Mood normal.        Speech: Speech normal.        Behavior: Behavior normal.        Thought Content: Thought content normal.        Judgment: Judgment normal.      UC Treatments / Results  Labs (all labs ordered are listed, but only abnormal results are displayed) Labs Reviewed - No data to display  EKG   Radiology No results found.  Procedures Procedures (including critical care time)  Medications Ordered in UC Medications - No data to display  Initial Impression / Assessment and Plan / UC Course  I have reviewed the triage vital signs and the nursing notes.  Pertinent labs & imaging results that were available during my care of the patient were reviewed by me and considered in my medical decision making (see chart for details).   1.  Polyarthralgia, left wrist pain Musculoskeletal findings are stable and mechanism of injury is atraumatic, therefore deferred imaging of the left wrist.  Low suspicion for fracture/dislocation.  She is neurovascularly intact distally to pain.  She is experiencing diffuse aching to the joints most notable to the left wrist.  Left wrist pain has not responded well to NSAIDs and Tylenol, therefore will initiate steroid Dosepak to reduce pain and swelling.  No NSAIDs  while taking steroid Dosepak.  Advised to take with food to avoid stomach upset.  RICE advised.  Left wrist brace placed to provide stability and compression.  Encouraged to follow-up with her primary care provider for further workup as I suspect she would benefit from lab work to rule out rheumatoid arthritis as well as other causes for polyarthralgia.  She plans on calling her PCP in the next week to schedule an appointment for  follow-up and possible referral to rheumatology/orthopedics.   Discussed physical exam and available lab work findings in clinic with patient.  Counseled patient regarding appropriate use of medications and potential side effects for all medications recommended or prescribed today. Discussed red flag signs and symptoms of worsening condition,when to call the PCP office, return to urgent care, and when to seek higher level of care in the emergency department. Patient verbalizes understanding and agreement with plan. All questions answered. Patient discharged in stable condition.    Final Clinical Impressions(s) / UC Diagnoses   Final diagnoses:  Polyarthralgia  Left wrist pain     Discharge Instructions      Take the steroid Dosepak sent to the pharmacy as prescribed.  Do not take any etodolac/diclofenac medication or motrin/ibuptofen/NSAIDs while taking steroid as this is similar medication. Take steroid with food to avoid stomach upset.  Steroid will help to reduce swelling and inflammation to your left wrist.  Wear the wrist splint applied in clinic to help with stability and compression to the wrist to reduce pain and inflammation.  Schedule a follow-up appointment with your primary care provider in the next 2 to 3 days to discuss your wrist pain and swelling further and for possible referral to rheumatology/orthopedics as needed for further workup.  If you develop any new or worsening symptoms or do not improve in the next 2 to 3 days, please return.  If your symptoms are severe, please go to the emergency room.  Follow-up with your primary care provider for further evaluation and management of your symptoms as well as ongoing wellness visits.  I hope you feel better!     ED Prescriptions     Medication Sig Dispense Auth. Provider   methylPREDNISolone (MEDROL DOSEPAK) 4 MG TBPK tablet Take as directed on box. 1 each Carlisle Beers, FNP      PDMP not reviewed this  encounter.   Carlisle Beers, [place] 09/02/22 [phone]

## 2022-09-02 NOTE — ED Triage Notes (Signed)
Pt sts left hand and arm pain and swelling starting yesterday; no injury noted; pt sts woke up this am with generalized joint pain

## 2022-09-02 NOTE — Discharge Instructions (Addendum)
Take the steroid Dosepak sent to the pharmacy as prescribed.  Do not take any etodolac/diclofenac medication or motrin/ibuptofen/NSAIDs while taking steroid as this is similar medication. Take steroid with food to avoid stomach upset.  Steroid will help to reduce swelling and inflammation to your left wrist.  Wear the wrist splint applied in clinic to help with stability and compression to the wrist to reduce pain and inflammation.  Schedule a follow-up appointment with your primary care provider in the next 2 to 3 days to discuss your wrist pain and swelling further and for possible referral to rheumatology/orthopedics as needed for further workup.  If you develop any new or worsening symptoms or do not improve in the next 2 to 3 days, please return.  If your symptoms are severe, please go to the emergency room.  Follow-up with your primary care provider for further evaluation and management of your symptoms as well as ongoing wellness visits.  I hope you feel better!

## 2022-09-25 ENCOUNTER — Encounter: Payer: Self-pay | Admitting: Family Medicine

## 2022-09-25 ENCOUNTER — Ambulatory Visit: Payer: Medicare PPO | Admitting: Family Medicine

## 2022-09-25 VITALS — BP 112/56 | HR 66 | Temp 98.1°F | Ht 64.0 in | Wt 163.0 lb

## 2022-09-25 DIAGNOSIS — Z8249 Family history of ischemic heart disease and other diseases of the circulatory system: Secondary | ICD-10-CM | POA: Diagnosis not present

## 2022-09-25 DIAGNOSIS — Z1211 Encounter for screening for malignant neoplasm of colon: Secondary | ICD-10-CM

## 2022-09-25 DIAGNOSIS — M2550 Pain in unspecified joint: Secondary | ICD-10-CM | POA: Diagnosis not present

## 2022-09-25 LAB — CBC WITH DIFFERENTIAL/PLATELET
Basophils Absolute: 0.1 10*3/uL (ref 0.0–0.1)
Basophils Relative: 0.9 % (ref 0.0–3.0)
Eosinophils Absolute: 0.4 10*3/uL (ref 0.0–0.7)
Eosinophils Relative: 6.1 % — ABNORMAL HIGH (ref 0.0–5.0)
HCT: 34.5 % — ABNORMAL LOW (ref 36.0–46.0)
Hemoglobin: 11.2 g/dL — ABNORMAL LOW (ref 12.0–15.0)
Lymphocytes Relative: 29.2 % (ref 12.0–46.0)
Lymphs Abs: 1.8 10*3/uL (ref 0.7–4.0)
MCHC: 32.6 g/dL (ref 30.0–36.0)
MCV: 84.3 fl (ref 78.0–100.0)
Monocytes Absolute: 0.5 10*3/uL (ref 0.1–1.0)
Monocytes Relative: 7.5 % (ref 3.0–12.0)
Neutro Abs: 3.4 10*3/uL (ref 1.4–7.7)
Neutrophils Relative %: 56.3 % (ref 43.0–77.0)
Platelets: 243 10*3/uL (ref 150.0–400.0)
RBC: 4.09 Mil/uL (ref 3.87–5.11)
RDW: 13.7 % (ref 11.5–15.5)
WBC: 6.1 10*3/uL (ref 4.0–10.5)

## 2022-09-25 LAB — COMPREHENSIVE METABOLIC PANEL
ALT: 20 U/L (ref 0–35)
AST: 20 U/L (ref 0–37)
Albumin: 4.3 g/dL (ref 3.5–5.2)
Alkaline Phosphatase: 48 U/L (ref 39–117)
BUN: 18 mg/dL (ref 6–23)
CO2: 29 mEq/L (ref 19–32)
Calcium: 9.3 mg/dL (ref 8.4–10.5)
Chloride: 105 mEq/L (ref 96–112)
Creatinine, Ser: 0.84 mg/dL (ref 0.40–1.20)
GFR: 72.78 mL/min (ref >60.00)
Glucose, Bld: 96 mg/dL (ref 70–99)
Potassium: 4.5 mEq/L (ref 3.5–5.1)
Sodium: 140 mEq/L (ref 135–145)
Total Bilirubin: 0.3 mg/dL (ref 0.2–1.2)
Total Protein: 7.2 g/dL (ref 6.0–8.3)

## 2022-09-25 LAB — URIC ACID: Uric Acid, Serum: 6.3 mg/dL (ref 2.4–7.0)

## 2022-09-25 MED ORDER — ETODOLAC 400 MG PO TABS: ORAL_TABLET | ORAL | 1 refills | Status: DC

## 2022-09-25 NOTE — Patient Instructions (Addendum)
You should get a call about seeing GI.  Go to the lab on the way out.   If you have mychart we'll likely use that to update you.    Take care.  Glad to see you.

## 2022-09-25 NOTE — Progress Notes (Signed)
Had been taking diclofenac since she ran out of etodolac.  UC eval 09/02/22.  Had L wrist pain and swelling w/o trauma.  No h/o gout.  Prednisone clearly helped.  She had diffuse joint pain in addition to the L wrist.  She has salty food the day prior.  No episodes like this prior.  L wrist was warm but not hot.  It was puffy at the time.  Mother had RA.  She had a tick bite recently, unclear   D/w patient [id number] for colon cancer screening, including IFOB vs. colonoscopy.  Risks and benefits of both were discussed and patient voiced understanding.  Pt elects for: colonoscopy.   FH CAD, d/w pt about CT calcium screening, ordered.  No CP.    Meds, vitals, and allergies reviewed.   ROS: Per HPI unless specifically indicated in ROS section   GEN: nad, alert and oriented HEENT: ncat NECK: supple w/o LA CV: rrr, SEM noted.  PULM: ctab, no inc wob ABD: soft EXT: no edema SKIN: no acute rash L wrist not ttp or red.    30 minutes were devoted to patient care in this encounter (this includes time spent reviewing the patient's file/history, interviewing and examining the patient, counseling/reviewing plan with patient).

## 2022-09-26 LAB — B. BURGDORFI ANTIBODIES: B burgdorferi Ab IgG+IgM: <0.9 index

## 2022-09-28 ENCOUNTER — Other Ambulatory Visit: Payer: Self-pay | Admitting: Family Medicine

## 2022-09-28 DIAGNOSIS — Z1211 Encounter for screening for malignant neoplasm of colon: Secondary | ICD-10-CM | POA: Insufficient documentation

## 2022-09-28 DIAGNOSIS — M2550 Pain in unspecified joint: Secondary | ICD-10-CM | POA: Insufficient documentation

## 2022-09-28 DIAGNOSIS — Z8249 Family history of ischemic heart disease and other diseases of the circulatory system: Secondary | ICD-10-CM | POA: Insufficient documentation

## 2022-09-28 DIAGNOSIS — D649 Anemia, unspecified: Secondary | ICD-10-CM

## 2022-09-28 NOTE — Assessment & Plan Note (Signed)
Refer to GI 

## 2022-09-28 NOTE — Assessment & Plan Note (Signed)
Of unclear source.  See notes on labs.  This could have been gout related.  She clearly improved with prednisone. We can see about rheum eval after getting labs back.  Discussed with patient.  She agrees.

## 2022-09-28 NOTE — Assessment & Plan Note (Signed)
Coronary calcium scoring ordered.  Rationale for screening discussed with patient.

## 2022-09-29 ENCOUNTER — Telehealth: Payer: Self-pay

## 2022-09-29 ENCOUNTER — Other Ambulatory Visit: Payer: Self-pay

## 2022-09-29 DIAGNOSIS — Z1211 Encounter for screening for malignant neoplasm of colon: Secondary | ICD-10-CM

## 2022-09-29 LAB — ROCKY MTN SPOTTED FVR ABS PNL(IGG+IGM)
RMSF IgG: NOT DETECTED
RMSF IgM: NOT DETECTED

## 2022-09-29 LAB — ANA W/REFLEX: Anti Nuclear Antibody (ANA): NEGATIVE

## 2022-09-29 LAB — RHEUMATOID FACTOR: Rheumatoid fact SerPl-aCnc: <10 IU/mL (ref <14)

## 2022-09-29 MED ORDER — NA SULFATE-K SULFATE-MG SULF 17.5-3.13-1.6 GM/177ML PO SOLN: 1.0000 | Freq: Once | ORAL | 0 refills | Status: AC

## 2022-09-29 NOTE — Telephone Encounter (Signed)
Gastroenterology Pre-Procedure Review  Request Date: 11/18/22 Requesting Physician: Dr. Dewayne Hatch  PATIENT REVIEW QUESTIONS: The patient responded to the following health history questions as indicated:    1. Are you having any GI issues? no 2. Do you have a personal history of Polyps? no 3. Do you have a family history of Colon Cancer or Polyps? no 4. Diabetes Mellitus? no 5. Joint replacements in the past 12 months?no 6. Major health problems in the past 3 months?no 7. Any artificial heart valves, MVP, or defibrillator?no    MEDICATIONS & ALLERGIES:    Patient reports the following regarding taking any anticoagulation/antiplatelet therapy:   Plavix, Coumadin, Eliquis, Xarelto, Lovenox, Pradaxa, Brilinta, or Effient? no Aspirin? no  Patient confirms/reports the following medications:  Current Outpatient Medications  Medication Sig Dispense Refill   etodolac (LODINE) 400 MG tablet TAKE 1 TABLET (400 MG TOTAL) BY MOUTH 2 (TWO) TIMES DAILY AS NEEDED (FOR PAIN. TAKE WITH FOOD.). 180 tablet 1   lisinopril (ZESTRIL) 10 MG tablet Take 1 tablet (10 mg total) by mouth daily. 90 tablet 3   loratadine (CLARITIN) 10 MG tablet TAKE 1 TABLET BY MOUTH EVERY DAY AS NEEDED 90 tablet 3   No current facility-administered medications for this visit.    Patient confirms/reports the following allergies:  No Known Allergies  No orders of the defined types were placed in this encounter.   AUTHORIZATION INFORMATION Primary Insurance: 1D#: Group #:  Secondary Insurance: 1D#: Group #:  SCHEDULE INFORMATION: Date: 11/18/22 Time: Location: [facility]

## 2022-09-30 ENCOUNTER — Ambulatory Visit
Admission: RE | Admit: 2022-09-30 | Discharge: 2022-09-30 | Disposition: A | Discharge status: Home / Self Care | Payer: Self-pay | Source: Ambulatory Visit | Attending: Family Medicine | Admitting: Family Medicine

## 2022-09-30 DIAGNOSIS — Z8249 Family history of ischemic heart disease and other diseases of the circulatory system: Secondary | ICD-10-CM | POA: Insufficient documentation

## 2022-10-02 ENCOUNTER — Encounter: Payer: Self-pay | Admitting: Family Medicine

## 2022-10-02 DIAGNOSIS — I2510 Atherosclerotic heart disease of native coronary artery without angina pectoris: Secondary | ICD-10-CM | POA: Insufficient documentation

## 2022-10-05 ENCOUNTER — Other Ambulatory Visit: Payer: Self-pay | Admitting: Family Medicine

## 2022-10-05 DIAGNOSIS — I2510 Atherosclerotic heart disease of native coronary artery without angina pectoris: Secondary | ICD-10-CM

## 2022-10-05 MED ORDER — ATORVASTATIN CALCIUM 10 MG PO TABS
10.0000 mg | ORAL_TABLET | Freq: Every day | ORAL | 3 refills | Status: DC
Start: 2022-10-05 — End: 2024-01-25

## 2022-10-06 ENCOUNTER — Other Ambulatory Visit (INDEPENDENT_AMBULATORY_CARE_PROVIDER_SITE_OTHER): Payer: Medicare PPO

## 2022-10-06 DIAGNOSIS — D649 Anemia, unspecified: Secondary | ICD-10-CM

## 2022-10-06 DIAGNOSIS — I2510 Atherosclerotic heart disease of native coronary artery without angina pectoris: Secondary | ICD-10-CM

## 2022-10-06 LAB — IRON: Iron: 92 ug/dL (ref 42–145)

## 2022-10-06 LAB — LIPID PANEL
Cholesterol: 178 mg/dL (ref 0–200)
HDL: 36.3 mg/dL — ABNORMAL LOW (ref >39.00)
NonHDL: 141.87
Total CHOL/HDL Ratio: 5
Triglycerides: 233 mg/dL — ABNORMAL HIGH (ref 0.0–149.0)
VLDL: 46.6 mg/dL — ABNORMAL HIGH (ref 0.0–40.0)

## 2022-10-06 LAB — FERRITIN: Ferritin: 144.7 ng/mL (ref 10.0–291.0)

## 2022-10-06 LAB — LDL CHOLESTEROL, DIRECT: Direct LDL: 117 mg/dL

## 2022-10-06 LAB — VITAMIN B12: Vitamin B-12: 193 pg/mL — ABNORMAL LOW (ref 211–911)

## 2022-10-08 ENCOUNTER — Other Ambulatory Visit: Payer: Self-pay | Admitting: Family Medicine

## 2022-10-08 DIAGNOSIS — D649 Anemia, unspecified: Secondary | ICD-10-CM

## 2022-10-08 DIAGNOSIS — E538 Deficiency of other specified B group vitamins: Secondary | ICD-10-CM | POA: Insufficient documentation

## 2022-10-08 DIAGNOSIS — I2510 Atherosclerotic heart disease of native coronary artery without angina pectoris: Secondary | ICD-10-CM

## 2022-11-07 ENCOUNTER — Telehealth: Payer: Self-pay | Admitting: Family Medicine

## 2022-11-07 MED ORDER — PREDNISONE 10 MG PO TABS: ORAL_TABLET | ORAL | 0 refills | Status: DC

## 2022-11-07 NOTE — Telephone Encounter (Signed)
Tried to call patient but call could not be completed

## 2022-11-07 NOTE — Telephone Encounter (Signed)
Sent to [facility].  Don't take etodolac while on prednisone.  If not better, then needs recheck in person.  Thanks.

## 2022-11-07 NOTE — Addendum Note (Signed)
Addended by: Joaquim Nam on: 11/07/2022 05:00 PM   Modules accepted: Orders

## 2022-11-07 NOTE — Telephone Encounter (Signed)
Patient called in statin that she is having an arthritis flare up and would like to know if Dr Para March can call her in some prednisone. She said that she has these flare ups from time to time and he sends it in for her.  [id number] [address] Sheen, [place] - [address] Phone: [phone]  Fax: [phone]

## 2022-11-11 NOTE — Telephone Encounter (Signed)
Patient has been notified

## 2022-11-17 ENCOUNTER — Encounter: Payer: Self-pay | Admitting: Gastroenterology

## 2022-11-18 ENCOUNTER — Ambulatory Visit
Admission: RE | Admit: 2022-11-18 | Discharge: 2022-11-18 | Disposition: A | Discharge status: Home / Self Care | Payer: Medicare PPO | Source: Ambulatory Visit | Attending: Gastroenterology | Admitting: Gastroenterology

## 2022-11-18 ENCOUNTER — Encounter: Payer: Self-pay | Admitting: Gastroenterology

## 2022-11-18 ENCOUNTER — Ambulatory Visit: Payer: Medicare PPO | Admitting: Anesthesiology

## 2022-11-18 ENCOUNTER — Encounter
Admission: RE | Disposition: A | Discharge status: Home / Self Care | Payer: Self-pay | Source: Ambulatory Visit | Attending: Gastroenterology

## 2022-11-18 DIAGNOSIS — I10 Essential (primary) hypertension: Secondary | ICD-10-CM | POA: Diagnosis not present

## 2022-11-18 DIAGNOSIS — I2510 Atherosclerotic heart disease of native coronary artery without angina pectoris: Secondary | ICD-10-CM | POA: Diagnosis not present

## 2022-11-18 DIAGNOSIS — Z1211 Encounter for screening for malignant neoplasm of colon: Secondary | ICD-10-CM

## 2022-11-18 DIAGNOSIS — Z539 Procedure and treatment not carried out, unspecified reason: Secondary | ICD-10-CM | POA: Diagnosis not present

## 2022-11-18 HISTORY — PX: COLONOSCOPY WITH PROPOFOL: SHX5780

## 2022-11-18 SURGERY — COLONOSCOPY WITH PROPOFOL
Anesthesia: General

## 2022-11-18 MED ORDER — SODIUM CHLORIDE 0.9 % IV SOLN: INTRAVENOUS | Status: DC

## 2022-11-18 MED ORDER — PROPOFOL 500 MG/50ML IV EMUL
INTRAVENOUS | Status: DC | PRN
  Administered 2022-11-18: 150 ug/kg/min via INTRAVENOUS
  Administered 2022-11-18: 70 mg via INTRAVENOUS

## 2022-11-18 MED ORDER — ONDANSETRON HCL 4 MG/2ML IJ SOLN
INTRAMUSCULAR | Status: DC | PRN
  Administered 2022-11-18: 4 mg via INTRAVENOUS

## 2022-11-18 MED ORDER — SODIUM CHLORIDE 0.9 % IV SOLN: INTRAVENOUS | Status: DC | PRN

## 2022-11-18 NOTE — Anesthesia Preprocedure Evaluation (Signed)
Anesthesia Evaluation  Patient identified by MRN, date of birth, ID band Patient awake    Reviewed: Allergy & Precautions, NPO status , Patient's Chart, lab work & pertinent test results  History of Anesthesia Complications (+) PONV and history of anesthetic complications  Airway Mallampati: III  TM Distance: <3 FB Neck ROM: full    Dental  (+) Chipped, Poor Dentition   Pulmonary neg pulmonary ROS, neg shortness of breath   Pulmonary exam normal        Cardiovascular Exercise Tolerance: Good hypertension, (-) angina + CAD  Normal cardiovascular exam+ Valvular Problems/Murmurs      Neuro/Psych negative neurological ROS  negative psych ROS   GI/Hepatic negative GI ROS, Neg liver ROS,neg GERD  ,,  Endo/Other  negative endocrine ROS    Renal/GU negative Renal ROS  negative genitourinary   Musculoskeletal   Abdominal   Peds  Hematology negative hematology ROS (+)   Anesthesia Other Findings Past Medical History: No date: Complication of anesthesia No date: Hypertension No date: Murmur     Comment:  aortic stenosis No date: Osteoarthritis No date: PONV (postoperative nausea and vomiting)  Past Surgical History: No date: ABDOMINAL HYSTERECTOMY 03/03/2019: ANTERIOR AND POSTERIOR REPAIR; N/A     Comment:  Procedure: ANTERIOR (CYSTOCELE) AND POSTERIOR REPAIR               (RECTOCELE);  Surgeon: Huel Cote, MD;  Location:               [facility];  Service: Gynecology;                Laterality: N/A; 1997: BACK SURGERY 03/03/2019: CYSTOSCOPY; N/A     Comment:  Procedure: CYSTOSCOPY;  Surgeon: Huel Cote, MD;               Location: [facility];  Service:               Gynecology;  Laterality: N/A; 03/03/2019: CYSTOSCOPY WITH URETEROSCOPY AND STENT PLACEMENT; Left     Comment:  Procedure: CYSTOSCOPY WITH URETEROSCOPY AND STENT               PLACEMENT;  Surgeon:  Huel Cote, MD;  Location:               [facility];  Service: Gynecology;                Laterality: Left; 03/03/2019: CYSTOSCOPY WITH URETEROSCOPY AND STENT PLACEMENT; Left     Comment:  Procedure: CYSTOSCOPY WITH URETEROSCOPY AND STENT               PLACEMENT;  Surgeon: Malen Gauze, MD;  Location:               [facility];  Service: Urology;                Laterality: Left; 1985: DILATION AND CURETTAGE OF UTERUS     Comment:  due to miscarriage 2016: EYE SURGERY; Bilateral No date: NOSE SURGERY 06/1996: SPINE SURGERY     Comment:  back surgery L5-S1 Dr Criss Alvine 03/03/2019: VAGINAL HYSTERECTOMY; N/A     Comment:  Procedure: HYSTERECTOMY VAGINAL;  Surgeon: Huel Cote, MD;  Location: [facility];                Service: Gynecology;  Laterality: N/A;  Reproductive/Obstetrics negative OB ROS                             Anesthesia Physical Anesthesia Plan  ASA: 3  Anesthesia Plan: General   Post-op Pain Management:    Induction: Intravenous  PONV Risk Score and Plan: Propofol infusion and TIVA  Airway Management Planned: Natural Airway and Nasal Cannula  Additional Equipment:   Intra-op Plan:   Post-operative Plan:   Informed Consent: I have reviewed the patients History and Physical, chart, labs and discussed the procedure including the risks, benefits and alternatives for the proposed anesthesia with the patient or authorized representative who has indicated his/her understanding and acceptance.     Dental Advisory Given  Plan Discussed with: Anesthesiologist, CRNA and Surgeon  Anesthesia Plan Comments: (Patient consented for risks of anesthesia including but not limited to:  - adverse reactions to medications - risk of airway placement if required - damage to eyes, teeth, lips or other oral mucosa - nerve damage due to positioning  - sore throat or  hoarseness - Damage to heart, brain, nerves, lungs, other parts of body or loss of life  Patient voiced understanding.)       Anesthesia Quick Evaluation

## 2022-11-18 NOTE — Anesthesia Postprocedure Evaluation (Signed)
Anesthesia Post Note  Patient: Miranda Willis  Procedure(s) Performed: COLONOSCOPY WITH PROPOFOL  Patient location during evaluation: PACU Anesthesia Type: General Level of consciousness: awake and alert Pain management: pain level controlled Vital Signs Assessment: post-procedure vital signs reviewed and stable Respiratory status: spontaneous breathing, nonlabored ventilation, respiratory function stable and patient connected to nasal cannula oxygen Cardiovascular status: blood pressure returned to baseline and stable Postop Assessment: no apparent nausea or vomiting Anesthetic complications: no   No notable events documented.   Last Vitals:  Vitals:   11/18/22 0833 11/18/22 0843  BP: (!) 110/43 (!) 131/58  Pulse: 70 66  Resp: 16 16  Temp:  (!) 36.1 C  SpO2: 100% 99%    Last Pain:  Vitals:   11/18/22 0843  TempSrc: Temporal                 Cleda Mccreedy [name]

## 2022-11-18 NOTE — H&P (Signed)
Wyline Mood, MD [address], [address], [place], [place], [address] [address], [address], [place], [place], [address] Phone: [phone]  Fax: [phone]  Primary Care Physician:  Joaquim Nam, MD   Pre-Procedure History & Physical: HPI:  Miranda Willis is a 67 y.o. female is here for an colonoscopy.   Past Medical History:  Diagnosis Date   Complication of anesthesia    Hypertension    Murmur    aortic stenosis   Osteoarthritis    PONV (postoperative nausea and vomiting)     Past Surgical History:  Procedure Laterality Date   ABDOMINAL HYSTERECTOMY     ANTERIOR AND POSTERIOR REPAIR N/A 03/03/2019   Procedure: ANTERIOR (CYSTOCELE) AND POSTERIOR REPAIR (RECTOCELE);  Surgeon: Huel Cote, MD;  Location: [facility];  Service: Gynecology;  Laterality: N/A;   BACK SURGERY  1997   CYSTOSCOPY N/A 03/03/2019   Procedure: CYSTOSCOPY;  Surgeon: Huel Cote, MD;  Location: [facility];  Service: Gynecology;  Laterality: N/A;   CYSTOSCOPY WITH URETEROSCOPY AND STENT PLACEMENT Left 03/03/2019   Procedure: CYSTOSCOPY WITH URETEROSCOPY AND STENT PLACEMENT;  Surgeon: Huel Cote, MD;  Location: [facility];  Service: Gynecology;  Laterality: Left;   CYSTOSCOPY WITH URETEROSCOPY AND STENT PLACEMENT Left 03/03/2019   Procedure: CYSTOSCOPY WITH URETEROSCOPY AND STENT PLACEMENT;  Surgeon: Malen Gauze, MD;  Location: [facility];  Service: Urology;  Laterality: Left;   DILATION AND CURETTAGE OF UTERUS  1985   due to miscarriage   EYE SURGERY Bilateral 2016   NOSE SURGERY     SPINE SURGERY  06/1996   back surgery L5-S1 Dr Criss Alvine   VAGINAL HYSTERECTOMY N/A 03/03/2019   Procedure: HYSTERECTOMY VAGINAL;  Surgeon: Huel Cote, MD;  Location: [facility];  Service: Gynecology;  Laterality: N/A;    Prior to Admission medications   Medication Sig Start Date End Date Taking?  Authorizing Provider  atorvastatin (LIPITOR) 10 MG tablet Take 1 tablet (10 mg total) by mouth daily. 10/05/22  Yes Joaquim Nam, MD  etodolac (LODINE) 400 MG tablet TAKE 1 TABLET (400 MG TOTAL) BY MOUTH 2 (TWO) TIMES DAILY AS NEEDED (FOR PAIN. TAKE WITH FOOD.). 09/25/22  Yes Joaquim Nam, MD  lisinopril (ZESTRIL) 10 MG tablet Take 1 tablet (10 mg total) by mouth daily. 11/12/21  Yes Joaquim Nam, MD  loratadine (CLARITIN) 10 MG tablet TAKE 1 TABLET BY MOUTH EVERY DAY AS NEEDED 11/10/21  Yes Joaquim Nam, MD  predniSONE (DELTASONE) 10 MG tablet Take 2 a day for 5 days, then 1 a day for 5 days, with food. Don't take with aleve/ibuprofen/etodolac. 11/07/22   Joaquim Nam, MD    Allergies as of 09/29/2022   (No Known Allergies)    Family History  Problem Relation Age of Onset   Heart disease Mother        MI   Depression Mother    Heart disease Father        MI   Depression Father    Heart disease Maternal Grandmother        MI and CAD   Depression Paternal Grandfather    Colon cancer Neg Hx    Breast cancer Neg Hx     Social History   Socioeconomic History   Marital status: Married    Spouse name: Not on file   Number of children: Not on file   Years of education: Not on  file   Highest education level: Not on file  Occupational History   Not on file  Tobacco Use   Smoking status: Never   Smokeless tobacco: Never  Vaping Use   Vaping status: Never Used  Substance and Sexual Activity   Alcohol use: No    Alcohol/week: 0.0 standard drinks of alcohol   Drug use: No   Sexual activity: Not on file  Other Topics Concern   Not on file  Social History Narrative   Retired from Engineer, petroleum at [employer]   6 grandkids as of 2023   Married 1980   Social Determinants of Corporate investment banker Strain: Not on file  Food Insecurity: Not on file  Transportation Needs: Not on file  Physical Activity: Not on file  Stress: Not on file  Social  Connections: Not on file  Intimate Partner Violence: Not on file    Review of Systems: See HPI, otherwise negative ROS  Physical Exam: BP (!) 145/68   Pulse 76   Temp (!) 97.2 F (36.2 C) (Temporal)   Resp 18   Wt 71.5 kg   SpO2 98%   BMI 27.05 kg/m  General:   Alert,  pleasant and cooperative in NAD Head:  Normocephalic and atraumatic. Neck:  Supple; no masses or thyromegaly. Lungs:  Clear throughout to auscultation, normal respiratory effort.    Heart:  +S1, +S2, Regular rate and rhythm, No edema. Abdomen:  Soft, nontender and nondistended. Normal bowel sounds, without guarding, and without rebound.   Neurologic:  Alert and  oriented x4;  grossly normal neurologically.  Impression/Plan: Miranda Willis is here for an colonoscopy to be performed for Screening colonoscopy average risk   Risks, benefits, limitations, and alternatives regarding  colonoscopy have been reviewed with the patient.  Questions have been answered.  All parties agreeable.   Wyline Mood, MD  11/18/2022, 7:45 AM

## 2022-11-18 NOTE — Transfer of Care (Signed)
Immediate Anesthesia Transfer of Care Note  Patient: Miranda Willis  Procedure(s) Performed: COLONOSCOPY WITH PROPOFOL  Patient Location: PACU  Anesthesia Type:MAC  Level of Consciousness: awake and alert   Airway & Oxygen Therapy: Patient Spontanous Breathing  Post-op Assessment: Report given to RN and Post -op Vital signs reviewed and stable  Post vital signs: Reviewed  Last Vitals:  Vitals Value Taken Time  BP 110/43 11/18/22 0833  Temp 22F   Pulse 70 11/18/22 0833  Resp 16 11/18/22 0833  SpO2 100 % 11/18/22 0833    Last Pain:  Vitals:   11/18/22 0734  TempSrc: Temporal         Complications: No notable events documented.

## 2022-11-18 NOTE — Op Note (Signed)
[facility] Gastroenterology Patient Name: Miranda Willis Procedure Date: 11/18/2022 8:18 AM MRN: [id number] Account #: [id number] Date of Birth: [birth year] Admit Type: Outpatient Age: 67 Room: [facility] ENDO ROOM 2 Gender: Female Note Status: Finalized Instrument Name: Prentice Docker [id number] Procedure:             Colonoscopy Indications:           Screening for colorectal malignant neoplasm Providers:             Wyline Mood MD, MD Referring MD:          Dwana Curd. Para March, MD (Referring MD) Medicines:             Monitored Anesthesia Care Complications:         No immediate complications. Procedure:             Pre-Anesthesia Assessment:                        - Prior to the procedure, a History and Physical was                         performed, and patient medications, allergies and                         sensitivities were reviewed. The patient's tolerance                         of previous anesthesia was reviewed.                        - The risks and benefits of the procedure and the                         sedation options and risks were discussed with the                         patient. All questions were answered and informed                         consent was obtained.                        - ASA Grade Assessment: II - A patient with mild                         systemic disease.                        After obtaining informed consent, the colonoscope was                         passed under direct vision. Throughout the procedure,                         the patient's blood pressure, pulse, and oxygen                         saturations were monitored continuously. The                         Colonoscope was  introduced through the anus with the                         intention of advancing to the cecum. The scope was                         advanced to the sigmoid colon before the procedure was                         aborted. Medications were given. The  colonoscopy was                         performed with ease. The patient tolerated the                         procedure well. The quality of the bowel preparation                         was inadequate. Findings:      The perianal and digital rectal examinations were normal.      A large amount of stool was found in the rectum and in the sigmoid       colon, making visualization difficult. Impression:            - Preparation of the colon was inadequate.                        - Stool in the rectum and in the sigmoid colon.                        - No specimens collected. Recommendation:        - Discharge patient to home (with escort).                        - Resume previous diet.                        - Continue present medications.                        - Repeat colonoscopy in 4 weeks because the bowel                         preparation was suboptimal. Procedure Code(s):     --- Professional ---                        [phone], 53, Colonoscopy, flexible; diagnostic,                         including collection of specimen(s) by brushing or                         washing, when performed (separate procedure) Diagnosis Code(s):     --- Professional ---                        Z12.11, Encounter for screening for malignant neoplasm  of colon CPT copyright 2022 American Medical Association. All rights reserved. The codes documented in this report are preliminary and upon coder review may  be revised to meet current compliance requirements. Wyline Mood, MD Wyline Mood MD, MD 11/18/2022 8:29:05 AM This report has been signed electronically. Number of Addenda: 0 Note Initiated On: 11/18/2022 8:18 AM Total Procedure Duration: 0 hours 0 minutes 45 seconds  Estimated Blood Loss:  Estimated blood loss: none.      [facility]

## 2022-11-19 ENCOUNTER — Encounter: Payer: Self-pay | Admitting: Gastroenterology

## 2022-11-24 ENCOUNTER — Ambulatory Visit: Payer: Medicare PPO | Admitting: Family Medicine

## 2022-12-11 ENCOUNTER — Other Ambulatory Visit: Payer: Self-pay | Admitting: Family Medicine

## 2022-12-14 ENCOUNTER — Ambulatory Visit (HOSPITAL_COMMUNITY)
Admission: EM | Admit: 2022-12-14 | Discharge: 2022-12-14 | Disposition: A | Discharge status: Home / Self Care | Payer: Medicare PPO | Attending: Emergency Medicine | Admitting: Emergency Medicine

## 2022-12-14 ENCOUNTER — Encounter (HOSPITAL_COMMUNITY): Payer: Self-pay

## 2022-12-14 DIAGNOSIS — B029 Zoster without complications: Secondary | ICD-10-CM | POA: Diagnosis not present

## 2022-12-14 MED ORDER — VALACYCLOVIR HCL 1 G PO TABS: 1000.0000 mg | ORAL_TABLET | Freq: Three times a day (TID) | ORAL | 0 refills | Status: AC

## 2022-12-14 NOTE — Discharge Instructions (Addendum)
Your rash and pain are consistent with shingles, please start the Valtrex as soon as possible.  You should take this medicine until all lesions have crusted over.  You are considered contagious until the lesions have crusted over as well, please keep them covered when around others and in public.   Return to clinic for any new or urgent symptoms.

## 2022-12-14 NOTE — ED Triage Notes (Signed)
Patient having back pain (Chronic), then pain in the left calf that has moved up into the left groin area. Onset 3 days ago. States the area is very sore. No known falls or injuries. No recent change in physical activity.   Tried tramadol and tylenol with no relief.

## 2022-12-14 NOTE — ED Provider Notes (Signed)
[facility]    CSN: [id number] Arrival date & time: 12/14/22  1527      History   Chief Complaint Chief Complaint  Patient presents with   Groin Pain   Leg Pain    HPI BRIGGS LYERLY is a 67 y.o. female.   Patient presents to clinic for a burning stabbing pain that is progressed from her right inner knee, up her thigh into her right groin area. The area has been throbbing and aching. Noticed a grouped vesicular rash when pulling down pants in clinic for evaluation, rash was not there earlier.   She took a tramadol last night to help her sleep and a Tylenol after lunch today, that did not help much with her pain.  She denies any history of DVTs.  She has had chickenpox.    The history is provided by the patient and medical records.  Groin Pain  Leg Pain   Past Medical History:  Diagnosis Date   Complication of anesthesia    Hypertension    Murmur    aortic stenosis   Osteoarthritis    PONV (postoperative nausea and vomiting)     Patient Active Problem List   Diagnosis Date Noted   B12 deficiency 10/08/2022   CAD (coronary artery disease) 10/02/2022   FH: CAD (coronary artery disease) 09/28/2022   Arthralgia 09/28/2022   Encounter for screening colonoscopy 09/28/2022   Vertigo 04/23/2022   Aortic stenosis 03/18/2020   Trochanteric bursitis 03/18/2020   S/P vaginal hysterectomy 03/03/2019   Other social stressor 09/26/2018   Routine general medical examination at a health care facility 11/29/2014   Advance care planning 11/29/2014   Hypertriglyceridemia 10/09/2013   Hyperglycemia 10/09/2013   Shoulder pain, left 10/15/2010   Osteoarthritis 06/05/2010   ALLERGIC RHINITIS 01/02/2009   DEGENERATIVE JOINT DISEASE, 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   ROTATOR CUFF SYNDROME, RIGHT 01/03/2008   Essential hypertension 12/15/2007    Past Surgical History:  Procedure Laterality Date   ABDOMINAL HYSTERECTOMY     ANTERIOR AND POSTERIOR REPAIR N/A 03/03/2019    Procedure: ANTERIOR (CYSTOCELE) AND POSTERIOR REPAIR (RECTOCELE);  Surgeon: Huel Cote, MD;  Location: [facility];  Service: Gynecology;  Laterality: N/A;   BACK SURGERY  1997   COLONOSCOPY WITH PROPOFOL N/A 11/18/2022   Procedure: COLONOSCOPY WITH PROPOFOL;  Surgeon: Wyline Mood, MD;  Location: [facility]COPY;  Service: Gastroenterology;  Laterality: N/A;   CYSTOSCOPY N/A 03/03/2019   Procedure: CYSTOSCOPY;  Surgeon: Huel Cote, MD;  Location: [facility];  Service: Gynecology;  Laterality: N/A;   CYSTOSCOPY WITH URETEROSCOPY AND STENT PLACEMENT Left 03/03/2019   Procedure: CYSTOSCOPY WITH URETEROSCOPY AND STENT PLACEMENT;  Surgeon: Huel Cote, MD;  Location: [facility];  Service: Gynecology;  Laterality: Left;   CYSTOSCOPY WITH URETEROSCOPY AND STENT PLACEMENT Left 03/03/2019   Procedure: CYSTOSCOPY WITH URETEROSCOPY AND STENT PLACEMENT;  Surgeon: Malen Gauze, MD;  Location: [facility];  Service: Urology;  Laterality: Left;   DILATION AND CURETTAGE OF UTERUS  1985   due to miscarriage   EYE SURGERY Bilateral 2016   NOSE SURGERY     SPINE SURGERY  06/1996   back surgery L5-S1 Dr Criss Alvine   VAGINAL HYSTERECTOMY N/A 03/03/2019   Procedure: HYSTERECTOMY VAGINAL;  Surgeon: Huel Cote, MD;  Location: [facility];  Service: Gynecology;  Laterality: N/A;    OB History   No obstetric history on file.      Home Medications  Prior to Admission medications   Medication Sig Start Date End Date Taking? Authorizing Provider  etodolac (LODINE) 400 MG tablet TAKE 1 TABLET (400 MG TOTAL) BY MOUTH 2 (TWO) TIMES DAILY AS NEEDED (FOR PAIN. TAKE WITH FOOD.). 09/25/22  Yes Joaquim Nam, MD  lisinopril (ZESTRIL) 10 MG tablet TAKE 1 TABLET BY MOUTH EVERY DAY 12/11/22  Yes Joaquim Nam, MD  valACYclovir (VALTREX) 1000 MG tablet Take 1 tablet (1,000 mg total) by mouth 3 (three) times daily for  7 days. 12/14/22 12/21/22 Yes Rinaldo Ratel, [place] N, FNP  atorvastatin (LIPITOR) 10 MG tablet Take 1 tablet (10 mg total) by mouth daily. 10/05/22   Joaquim Nam, MD    Family History Family History  Problem Relation Age of Onset   Heart disease Mother        MI   Depression Mother    Heart disease Father        MI   Depression Father    Heart disease Maternal Grandmother        MI and CAD   Depression Paternal Grandfather    Colon cancer Neg Hx    Breast cancer Neg Hx     Social History Social History   Tobacco Use   Smoking status: Never   Smokeless tobacco: Never  Vaping Use   Vaping status: Never Used  Substance Use Topics   Alcohol use: No    Alcohol/week: 0.0 standard drinks of alcohol   Drug use: No     Allergies   Patient has no known allergies.   Review of Systems Review of Systems  Skin:  Positive for rash.     Physical Exam Triage Vital Signs ED Triage Vitals  Encounter Vitals Group     BP 12/14/22 1703 (!) 180/66     Systolic BP Percentile --      Diastolic BP Percentile --      Pulse Rate 12/14/22 1703 73     Resp 12/14/22 1703 18     Temp 12/14/22 1703 98 F (36.7 C)     Temp Source 12/14/22 1703 Oral     SpO2 12/14/22 1703 95 %     Weight 12/14/22 1703 161 lb (73 kg)     Height 12/14/22 1703 5\' 4"  (1.626 m)     Head Circumference --      Peak Flow --      Pain Score 12/14/22 1701 8     Pain Loc --      Pain Education --      Exclude from Growth Chart --    No data found.  Updated Vital Signs BP (!) 180/66 (BP Location: Right Arm)   Pulse 73   Temp 98 F (36.7 C) (Oral)   Resp 18   Ht 5\' 4"  (1.626 m)   Wt 161 lb (73 kg)   SpO2 95%   BMI 27.64 kg/m   Visual Acuity Right Eye Distance:   Left Eye Distance:   Bilateral Distance:    Right Eye Near:   Left Eye Near:    Bilateral Near:     Physical Exam Vitals and nursing note reviewed.  Constitutional:      Appearance: Normal appearance.  HENT:     Head: Normocephalic  and atraumatic.     Right Ear: External ear normal.     Left Ear: External ear normal.     Nose: Nose normal.     Mouth/Throat:     Mouth: Mucous membranes  are moist.  Eyes:     Conjunctiva/sclera: Conjunctivae normal.  Cardiovascular:     Rate and Rhythm: Normal rate.  Pulmonary:     Effort: Pulmonary effort is normal. No respiratory distress.  Musculoskeletal:        General: Normal range of motion.  Skin:    General: Skin is warm and dry.     Findings: Rash present.          Comments: Grouped vesicular lesions along left inner thigh.   Neurological:     General: No focal deficit present.     Mental Status: She is alert and oriented to person, place, and time.  Psychiatric:        Mood and Affect: Mood normal.        Behavior: Behavior normal. Behavior is cooperative.      UC Treatments / Results  Labs (all labs ordered are listed, but only abnormal results are displayed) Labs Reviewed - No data to display  EKG   Radiology No results found.  Procedures Procedures (including critical care time)  Medications Ordered in UC Medications - No data to display  Initial Impression / Assessment and Plan / UC Course  I have reviewed the triage vital signs and the nursing notes.  Pertinent labs & imaging results that were available during my care of the patient were reviewed by me and considered in my medical decision making (see chart for details).  Vitals and triage reviewed, patient is hemodynamically stable.  Reports her hypertension is due to her pain.  No headache or vision changes, low concern for hypertensive urgency.  Has a grouped vesicular rash to left inner thigh consistent with herpes zoster.  No recent travel, no calf pain, low concern for DVT.  Will start on Valtrex as soon as possible.  Plan of care, follow-up care and return precautions given, no questions at this time.     Final Clinical Impressions(s) / UC Diagnoses   Final diagnoses:  Herpes zoster  without complication     Discharge Instructions      Your rash and pain are consistent with shingles, please start the Valtrex as soon as possible.  You should take this medicine until all lesions have crusted over.  You are considered contagious until the lesions have crusted over as well, please keep them covered when around others and in public.   Return to clinic for any new or urgent symptoms.     ED Prescriptions     Medication Sig Dispense Auth. Provider   valACYclovir (VALTREX) 1000 MG tablet Take 1 tablet (1,000 mg total) by mouth 3 (three) times daily for 7 days. 21 tablet Miklo Aken, [place], [place]      PDMP not reviewed this encounter.   Sol Odor, [place], [place] 12/14/22 [id number]

## 2022-12-16 ENCOUNTER — Telehealth: Payer: Self-pay | Admitting: Family Medicine

## 2022-12-16 MED ORDER — TRAMADOL HCL 50 MG PO TABS: 50.0000 mg | ORAL_TABLET | Freq: Three times a day (TID) | ORAL | 0 refills | Status: AC | PRN

## 2022-12-16 NOTE — Telephone Encounter (Signed)
Valtrex is the best option at this point to get the spots to crust over.   Unclear when the pain will get better.  I sent extra tramadol in the meantime.  Sedation caution.  Thanks.

## 2022-12-16 NOTE — Telephone Encounter (Signed)
Called patient states she has been taking valtrex as directed. She states that she wanted to know if there was anything she can put on it that can dry them up. Patient had some Tramadol that she had after foot surgery to help for pain.states that she has several left of that. She states that taking 1 tab tid helps with pain and lets her rest.  States that she seen online were a paste with baking soda and Corn starch helped little with pain.  Shingles rash is on inside of right leg into groin. Sates that rash has not changed much but blisters are increased a little.

## 2022-12-16 NOTE — Telephone Encounter (Signed)
Patient notified rx was sent for tramadol and to continue valtrex.

## 2022-12-16 NOTE — Telephone Encounter (Signed)
Patient called in and stated that she was diagnosed with shingles. She was wanting to know what she could do for the pain or take for it. Please advise. Thank you!

## 2022-12-22 ENCOUNTER — Ambulatory Visit (HOSPITAL_COMMUNITY)
Admission: EM | Admit: 2022-12-22 | Discharge: 2022-12-22 | Disposition: A | Discharge status: Home / Self Care | Payer: Medicare PPO | Attending: Family Medicine | Admitting: Family Medicine

## 2022-12-22 ENCOUNTER — Encounter (HOSPITAL_COMMUNITY): Payer: Self-pay

## 2022-12-22 ENCOUNTER — Telehealth: Payer: Self-pay | Admitting: Family Medicine

## 2022-12-22 DIAGNOSIS — B029 Zoster without complications: Secondary | ICD-10-CM

## 2022-12-22 MED ORDER — VALACYCLOVIR HCL 1 G PO TABS: 1000.0000 mg | ORAL_TABLET | Freq: Three times a day (TID) | ORAL | 0 refills | Status: AC

## 2022-12-22 NOTE — ED Triage Notes (Signed)
Pt is here for possible shingles outbreak near her groin area. Pt reports the area is red and itchy.

## 2022-12-22 NOTE — ED Provider Notes (Signed)
[facility]    CSN: [id number] Arrival date & time: 12/22/22  1018      History   Chief Complaint No chief complaint on file.   HPI Miranda Willis is a 67 y.o. female.   Patient is presenting today approximately 1 week after being diagnosed with shingles.  Patient states that she has been taking her Valtrex and finished her last dose yesterday.  Patient states that she noticed she had a similar like rash in her groin area.  Patient states that the previously for rash and pain were located on her inner thigh and that when she is on the new rash developing on her groin she was concerned and decided to come in.  Patient states that the pain is anywhere between 6 and 8.  Patient's main concern right now is that she was not sure if she needed to be seen again as she thought she may have gotten the shingles again.  Patient otherwise has no other concerns at this time.  No vision changes.  No headache.  Patient's blood pressure is elevated today but patient states that she has not taken her medication.     Past Medical History:  Diagnosis Date   Complication of anesthesia    Hypertension    Murmur    aortic stenosis   Osteoarthritis    PONV (postoperative nausea and vomiting)     Patient Active Problem List   Diagnosis Date Noted   B12 deficiency 10/08/2022   CAD (coronary artery disease) 10/02/2022   FH: CAD (coronary artery disease) 09/28/2022   Arthralgia 09/28/2022   Encounter for screening colonoscopy 09/28/2022   Vertigo 04/23/2022   Aortic stenosis 03/18/2020   Trochanteric bursitis 03/18/2020   S/P vaginal hysterectomy 03/03/2019   Other social stressor 09/26/2018   Routine general medical examination at a health care facility 11/29/2014   Advance care planning 11/29/2014   Hypertriglyceridemia 10/09/2013   Hyperglycemia 10/09/2013   Shoulder pain, left 10/15/2010   Osteoarthritis 06/05/2010   ALLERGIC RHINITIS 01/02/2009   DEGENERATIVE JOINT DISEASE,  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   ROTATOR CUFF SYNDROME, RIGHT 01/03/2008   Essential hypertension 12/15/2007    Past Surgical History:  Procedure Laterality Date   ABDOMINAL HYSTERECTOMY     ANTERIOR AND POSTERIOR REPAIR N/A 03/03/2019   Procedure: ANTERIOR (CYSTOCELE) AND POSTERIOR REPAIR (RECTOCELE);  Surgeon: Huel Cote, MD;  Location: [facility];  Service: Gynecology;  Laterality: N/A;   BACK SURGERY  1997   COLONOSCOPY WITH PROPOFOL N/A 11/18/2022   Procedure: COLONOSCOPY WITH PROPOFOL;  Surgeon: Wyline Mood, MD;  Location: [facility] ENDOSCOPY;  Service: Gastroenterology;  Laterality: N/A;   CYSTOSCOPY N/A 03/03/2019   Procedure: CYSTOSCOPY;  Surgeon: Huel Cote, MD;  Location: [facility];  Service: Gynecology;  Laterality: N/A;   CYSTOSCOPY WITH URETEROSCOPY AND STENT PLACEMENT Left 03/03/2019   Procedure: CYSTOSCOPY WITH URETEROSCOPY AND STENT PLACEMENT;  Surgeon: Huel Cote, MD;  Location: [facility];  Service: Gynecology;  Laterality: Left;   CYSTOSCOPY WITH URETEROSCOPY AND STENT PLACEMENT Left 03/03/2019   Procedure: CYSTOSCOPY WITH URETEROSCOPY AND STENT PLACEMENT;  Surgeon: Malen Gauze, MD;  Location: [facility];  Service: Urology;  Laterality: Left;   DILATION AND CURETTAGE OF UTERUS  1985   due to miscarriage   EYE SURGERY Bilateral 2016   NOSE SURGERY     SPINE SURGERY  06/1996   back surgery L5-S1 Dr Criss Alvine   VAGINAL HYSTERECTOMY N/A 03/03/2019  Procedure: HYSTERECTOMY VAGINAL;  Surgeon: Huel Cote, MD;  Location: [facility];  Service: Gynecology;  Laterality: N/A;    OB History   No obstetric history on file.      Home Medications    Prior to Admission medications   Medication Sig Start Date End Date Taking? Authorizing Provider  atorvastatin (LIPITOR) 10 MG tablet Take 1 tablet (10 mg total) by mouth daily. 10/05/22  Yes Joaquim Nam, MD  etodolac  (LODINE) 400 MG tablet TAKE 1 TABLET (400 MG TOTAL) BY MOUTH 2 (TWO) TIMES DAILY AS NEEDED (FOR PAIN. TAKE WITH FOOD.). 09/25/22  Yes Joaquim Nam, MD  lisinopril (ZESTRIL) 10 MG tablet TAKE 1 TABLET BY MOUTH EVERY DAY 12/11/22  Yes Joaquim Nam, MD  valACYclovir (VALTREX) 1000 MG tablet Take 1 tablet (1,000 mg total) by mouth 3 (three) times daily for 5 days. 12/22/22 12/27/22 Yes Brenton Grills, MD    Family History Family History  Problem Relation Age of Onset   Heart disease Mother        MI   Depression Mother    Heart disease Father        MI   Depression Father    Heart disease Maternal Grandmother        MI and CAD   Depression Paternal Grandfather    Colon cancer Neg Hx    Breast cancer Neg Hx     Social History Social History   Tobacco Use   Smoking status: Never   Smokeless tobacco: Never  Vaping Use   Vaping status: Never Used  Substance Use Topics   Alcohol use: No    Alcohol/week: 0.0 standard drinks of alcohol   Drug use: No     Allergies   Patient has no known allergies.   Review of Systems Review of Systems  Constitutional:  Negative for chills and fever.  HENT:  Negative for ear pain and sore throat.   Eyes:  Negative for pain and visual disturbance.  Respiratory:  Negative for cough and shortness of breath.   Cardiovascular:  Negative for chest pain and palpitations.  Gastrointestinal:  Negative for abdominal pain and vomiting.  Genitourinary:  Negative for dysuria and hematuria.  Musculoskeletal:  Negative for arthralgias and back pain.  Skin:  Positive for rash. Negative for color change.  Neurological:  Negative for seizures and syncope.  All other systems reviewed and are negative.    Physical Exam Triage Vital Signs ED Triage Vitals [12/22/22 1146]  Encounter Vitals Group     BP (!) 174/77     Systolic BP Percentile      Diastolic BP Percentile      Pulse Rate 78     Resp 16     Temp 97.9 F (36.6 C)     Temp Source Oral      SpO2 95 %     Weight      Height      Head Circumference      Peak Flow      Pain Score      Pain Loc      Pain Education      Exclude from Growth Chart    No data found.  Updated Vital Signs BP (!) 174/77 (BP Location: Left Arm)   Pulse 78   Temp 97.9 F (36.6 C) (Oral)   Resp 16   SpO2 95%   Visual Acuity Right Eye Distance:   Left Eye Distance:  Bilateral Distance:    Right Eye Near:   Left Eye Near:    Bilateral Near:     Physical Exam Vitals and nursing note reviewed.  Constitutional:      General: She is not in acute distress.    Appearance: She is well-developed.  HENT:     Head: Normocephalic and atraumatic.  Eyes:     Conjunctiva/sclera: Conjunctivae normal.  Cardiovascular:     Heart sounds: No murmur heard. Abdominal:     Palpations: Abdomen is soft.     Tenderness: There is no abdominal tenderness.  Musculoskeletal:        General: No swelling.     Cervical back: Neck supple.  Skin:    General: Skin is warm and dry.     Capillary Refill: Capillary refill takes less than 2 seconds.     Findings: Rash present.     Comments: Herpetic like rash noted on the groin as well as the inner thigh.  Rash is consistent with shingles.  Neurological:     Mental Status: She is alert.  Psychiatric:        Mood and Affect: Mood normal.      UC Treatments / Results  Labs (all labs ordered are listed, but only abnormal results are displayed) Labs Reviewed - No data to display  EKG   Radiology No results found.  Procedures Procedures (including critical care time)  Medications Ordered in UC Medications - No data to display  Initial Impression / Assessment and Plan / UC Course  I have reviewed the triage vital signs and the nursing notes.  Pertinent labs & imaging results that were available during my care of the patient were reviewed by me and considered in my medical decision making (see chart for details).     At this time, no concern  regarding the shingles infection however we can extend patient's Valtrex for another 5 days.  Patient was also offered a steroid if the pain persists however patient would like to avoid that for now.  Patient was given information regarding postherpetic neuralgia.  Patient advised to continue staying hydrated while taking the Valtrex.  No other concerns at this time Final Clinical Impressions(s) / UC Diagnoses   Final diagnoses:  Herpes zoster without complication     Discharge Instructions      Please take your Valtrex 1 pill 3 times a day for the next 5 days.  You may use ibuprofen or topical capsaicin cream.  You may also try topical lidocaine cream.  If you feel like the burning sensation is not going away then you may have postherpetic neuralgia.  I have included reading material so you can read more about what the expectations are of shingles.   ED Prescriptions     Medication Sig Dispense Auth. Provider   valACYclovir (VALTREX) 1000 MG tablet Take 1 tablet (1,000 mg total) by mouth 3 (three) times daily for 5 days. 15 tablet Brenton Grills, MD      PDMP not reviewed this encounter.   Brenton Grills, MD 12/22/22 1201

## 2022-12-22 NOTE — Discharge Instructions (Signed)
Please take your Valtrex 1 pill 3 times a day for the next 5 days.  You may use ibuprofen or topical capsaicin cream.  You may also try topical lidocaine cream.  If you feel like the burning sensation is not going away then you may have postherpetic neuralgia.  I have included reading material so you can read more about what the expectations are of shingles.

## 2022-12-22 NOTE — Telephone Encounter (Signed)
error 

## 2022-12-30 ENCOUNTER — Telehealth: Payer: Self-pay | Admitting: Family Medicine

## 2022-12-30 NOTE — Telephone Encounter (Signed)
Patient has been scheduled

## 2022-12-30 NOTE — Telephone Encounter (Signed)
Patient needs eval before meds or referral can be done

## 2022-12-30 NOTE — Telephone Encounter (Signed)
Patient called in and stated that she having pain in her hip again. She stated that in the oast she was prescribed prednisone. She was wanting to know if he would send her in some and if he think she will need a referral to an ortho office. Informed patient she may need an appointment first for something to be sent in. Please advise. Thank you!

## 2023-01-01 ENCOUNTER — Ambulatory Visit: Payer: Medicare PPO | Admitting: Family Medicine

## 2023-01-01 ENCOUNTER — Ambulatory Visit
Admission: RE | Admit: 2023-01-01 | Discharge: 2023-01-01 | Disposition: A | Discharge status: Home / Self Care | Payer: Medicare PPO | Source: Ambulatory Visit | Attending: Family Medicine | Admitting: Family Medicine

## 2023-01-01 ENCOUNTER — Encounter: Payer: Self-pay | Admitting: Family Medicine

## 2023-01-01 VITALS — BP 132/70 | HR 71 | Temp 97.0°F | Ht 64.0 in | Wt 154.0 lb

## 2023-01-01 DIAGNOSIS — M25552 Pain in left hip: Secondary | ICD-10-CM | POA: Diagnosis not present

## 2023-01-01 DIAGNOSIS — E538 Deficiency of other specified B group vitamins: Secondary | ICD-10-CM | POA: Diagnosis not present

## 2023-01-01 DIAGNOSIS — B029 Zoster without complications: Secondary | ICD-10-CM

## 2023-01-01 DIAGNOSIS — M25559 Pain in unspecified hip: Secondary | ICD-10-CM

## 2023-01-01 MED ORDER — CYANOCOBALAMIN 1000 MCG/ML IJ SOLN
1000.0000 ug | Freq: Once | INTRAMUSCULAR | Status: AC
Start: 2023-01-01 — End: 2023-01-01
  Administered 2023-01-01: 1000 ug via INTRAMUSCULAR

## 2023-01-01 MED ORDER — CYANOCOBALAMIN 1000 MCG/ML IJ SOLN: INTRAMUSCULAR | Status: DC

## 2023-01-01 MED ORDER — LIDOCAINE 4 % EX PTCH: 1.0000 | MEDICATED_PATCH | CUTANEOUS | Status: DC

## 2023-01-01 MED ORDER — PREDNISONE 10 MG PO TABS: ORAL_TABLET | ORAL | 0 refills | Status: DC

## 2023-01-01 NOTE — Progress Notes (Signed)
Hip Pain  Left hip pain flare. Patient states she usually takes prednisone for this and would like a refill.  Steroid cautions d/w pt.  Has used nsaids in the meantime- tried etodolac vs diclofenac.  It would help temporarily.  This is similar to prev flares.  She had some leftover prednisone, took 10mg  with some relief.  Pain walking.    Shingles Patient is still having pain from the shingles on L medial thigh. Valacyclovir helped treat shingles but still having nerve pain.  Done with valtrex.  Tramadol helped with pain. Still hypersensitive and itchy locally. Vaccine d/w pt.   B12 low, d/w pt.  Not on replacement yet.  Discussed B12 start.  Rationale for use discussed with patient.  Meds, vitals, and allergies reviewed.   ROS: Per HPI unless specifically indicated in ROS section   Nad Ncat Neck supple, no LA Rrr Ctab L hip pain with int rotation and hip flexion.  Ttp at the L greater trochanter area.   Hypersensitive area on the L medial thigh.

## 2023-01-01 NOTE — Patient Instructions (Addendum)
Go to the lab on the way out.   If you have mychart we'll likely use that to update you.    Restart prednisone.  Hold diclofenac and etodolac while on prednisone.    B12 shot today.  Schedule nurse visit in about 2 weeks to get teaching on B12 shots.    Try getting a lidocaine patch for your leg.  Tramadol at night if needed.    Take care.  Glad to see you.

## 2023-01-03 DIAGNOSIS — B029 Zoster without complications: Secondary | ICD-10-CM | POA: Insufficient documentation

## 2023-01-03 DIAGNOSIS — M25559 Pain in unspecified hip: Secondary | ICD-10-CM | POA: Insufficient documentation

## 2023-01-03 NOTE — Assessment & Plan Note (Signed)
She has pain on range of motion at the hip and her greater trochanteric area is also tender.  Start prednisone with routine steroid cautions.  See notes on imaging.  Hold diclofenac and etodolac while taking prednisone.

## 2023-01-03 NOTE — Assessment & Plan Note (Signed)
No new lesions.  No need to restart Valtrex.  Use lidocaine patch.  She can get that over-the-counter.  Tramadol at night if needed.  Sedation caution.

## 2023-01-03 NOTE — Assessment & Plan Note (Signed)
B12 low, d/w pt.  Not on replacement yet.  Discussed B12 start.  Rationale for use discussed with patient.  Dose today and return in 2 weeks for teaching.  She is going to be out of town next week, so that we will push her return visit back by 1 week.

## 2023-01-12 ENCOUNTER — Ambulatory Visit (INDEPENDENT_AMBULATORY_CARE_PROVIDER_SITE_OTHER): Payer: Medicare PPO

## 2023-01-12 VITALS — Ht 64.0 in | Wt 157.0 lb

## 2023-01-12 DIAGNOSIS — Z0000 Encounter for general adult medical examination without abnormal findings: Secondary | ICD-10-CM | POA: Diagnosis not present

## 2023-01-12 NOTE — Patient Instructions (Signed)
Miranda Willis , Thank you for taking time to come for your Medicare Wellness Visit. I appreciate your ongoing commitment to your health goals. Please review the following plan we discussed and let me know if I can assist you in the future.   Referrals/Orders/Follow-Ups/Clinician Recommendations: Aim for 30 minutes of exercise or brisk walking, 6-8 glasses of water, and 5 servings of fruits and vegetables each day.   This is a list of the screening recommended for you and due dates:  Health Maintenance  Topic Date Due   Zoster (Shingles) Vaccine (1 of 2) Never done   Mammogram  11/20/2014   Cologuard (Stool DNA test)  06/17/2021   Pneumonia Vaccine (1 of 1 - PCV) Never done   DEXA scan (bone density measurement)  Never done   Flu Shot  07/13/2023*   Medicare Annual Wellness Visit  01/12/2024   DTaP/Tdap/Td vaccine (4 - Td or Tdap) 09/04/2029   Hepatitis C Screening  Completed   HPV Vaccine  Aged Out   Colon Cancer Screening  Discontinued   COVID-19 Vaccine  Discontinued  *Topic was postponed. The date shown is not the original due date.    Advanced directives: (Provided) Advance directive discussed with you today. I have provided a copy for you to complete at home and have notarized. Once this is complete, please bring a copy in to our office so we can scan it into your chart. Information on Advanced Care Planning can be found at [facility] [place] Advance Health Care Directives Advance Health Care Directives ([email or web address]    Next Medicare Annual Wellness Visit scheduled for next year: Yes  Insert Preventive Care attachment Insert FALL PREVENTION attachment if needed

## 2023-01-12 NOTE — Progress Notes (Signed)
Subjective:   Miranda Willis is a 67 y.o. female who presents for an Initial Medicare Annual Wellness Visit.  Visit Complete: Virtual  I connected with  Miranda Willis on 01/12/23 by a audio enabled telemedicine application and verified that I am speaking with the correct person using two identifiers.  Patient Location: Home  Provider Location: Home Office  I discussed the limitations of evaluation and management by telemedicine. The patient expressed understanding and agreed to proceed.  Patient Medicare AWV questionnaire was completed by the patient on 01/12/2023; I have confirmed that all information answered by patient is correct and no changes since this date.  Cardiac Risk Factors include: advanced age (>44men, >44 women);dyslipidemia;hypertensionBecause this visit was a virtual/telehealth visit, some criteria may be missing or patient reported. Any vitals not documented were not able to be obtained and vitals that have been documented are patient reported.       Objective:    Today's Vitals   01/12/23 1446  Weight: 157 lb (71.2 kg)  Height: 5\' 4"  (1.626 m)   Body mass index is 26.95 kg/m.     01/12/2023    2:49 PM 03/03/2019    5:52 AM 02/28/2019    8:18 AM  Advanced Directives  Does Patient Have a Medical Advance Directive? No No No  Would patient like information on creating a medical advance directive? Yes (MAU/Ambulatory/Procedural Areas - Information given) No - Patient declined     Current Medications (verified) Outpatient Encounter Medications as of 01/12/2023  Medication Sig   atorvastatin (LIPITOR) 10 MG tablet Take 1 tablet (10 mg total) by mouth daily.   cyanocobalamin (VITAMIN B12) 1000 MCG/ML injection IM weekly x4 weeks then monthly after that.   etodolac (LODINE) 400 MG tablet TAKE 1 TABLET (400 MG TOTAL) BY MOUTH 2 (TWO) TIMES DAILY AS NEEDED (FOR PAIN. TAKE WITH FOOD.).   lidocaine 4 % Place 1 patch onto the skin daily.   lisinopril  (ZESTRIL) 10 MG tablet TAKE 1 TABLET BY MOUTH EVERY DAY   predniSONE (DELTASONE) 10 MG tablet Take 2 a day for 5 days, then 1 a day for 5 days, with food. Don't take with aleve/ibuprofen/etodolac.   traMADol (ULTRAM) 50 MG tablet Take 50 mg by mouth every 12 (twelve) hours as needed for moderate pain (sedation caution).   No facility-administered encounter medications on file as of 01/12/2023.    Allergies (verified) Patient has no known allergies.   History: Past Medical History:  Diagnosis Date   Complication of anesthesia    Hypertension    Murmur    aortic stenosis   Osteoarthritis    PONV (postoperative nausea and vomiting)    Past Surgical History:  Procedure Laterality Date   ABDOMINAL HYSTERECTOMY     ANTERIOR AND POSTERIOR REPAIR N/A 03/03/2019   Procedure: ANTERIOR (CYSTOCELE) AND POSTERIOR REPAIR (RECTOCELE);  Surgeon: Huel Cote, MD;  Location: [facility];  Service: Gynecology;  Laterality: N/A;   BACK SURGERY  1997   COLONOSCOPY WITH PROPOFOL N/A 11/18/2022   Procedure: COLONOSCOPY WITH PROPOFOL;  Surgeon: Wyline Mood, MD;  Location: [facility];  Service: Gastroenterology;  Laterality: N/A;   CYSTOSCOPY N/A 03/03/2019   Procedure: CYSTOSCOPY;  Surgeon: Huel Cote, MD;  Location: [facility];  Service: Gynecology;  Laterality: N/A;   CYSTOSCOPY WITH URETEROSCOPY AND STENT PLACEMENT Left 03/03/2019   Procedure: CYSTOSCOPY WITH URETEROSCOPY AND STENT PLACEMENT;  Surgeon: Huel Cote, MD;  Location: [facility];  Service:  Gynecology;  Laterality: Left;   CYSTOSCOPY WITH URETEROSCOPY AND STENT PLACEMENT Left 03/03/2019   Procedure: CYSTOSCOPY WITH URETEROSCOPY AND STENT PLACEMENT;  Surgeon: Malen Gauze, MD;  Location: [facility];  Service: Urology;  Laterality: Left;   DILATION AND CURETTAGE OF UTERUS  1985   due to miscarriage   EYE SURGERY Bilateral 2016   NOSE SURGERY     SPINE  SURGERY  06/1996   back surgery L5-S1 Dr Criss Alvine   VAGINAL HYSTERECTOMY N/A 03/03/2019   Procedure: HYSTERECTOMY VAGINAL;  Surgeon: Huel Cote, MD;  Location: [facility]  Service: Gynecology;  Laterality: N/A;   Family History  Problem Relation Age of Onset   Heart disease Mother        MI   Depression Mother    Heart disease Father        MI   Depression Father    Heart disease Maternal Grandmother        MI and CAD   Depression Paternal Grandfather    Colon cancer Neg Hx    Breast cancer Neg Hx    Social History   Socioeconomic History   Marital status: Married    Spouse name: Not on file   Number of children: Not on file   Years of education: Not on file   Highest education level: Not on file  Occupational History   Not on file  Tobacco Use   Smoking status: Never   Smokeless tobacco: Never  Vaping Use   Vaping status: Never Used  Substance and Sexual Activity   Alcohol use: No    Alcohol/week: 0.0 standard drinks of alcohol   Drug use: No   Sexual activity: Not on file  Other Topics Concern   Not on file  Social History Narrative   Retired from Engineer, petroleum at [employer]   6 grandkids as of 2023   Married 1980   Social Determinants of Health   Financial Resource Strain: Low Risk  (01/12/2023)   Overall Financial Resource Strain (CARDIA)    Difficulty of Paying Living Expenses: Not hard at all  Food Insecurity: No Food Insecurity (01/12/2023)   Hunger Vital Sign    Worried About Running Out of Food in the Last Year: Never true    Ran Out of Food in the Last Year: Never true  Transportation Needs: No Transportation Needs (01/12/2023)   PRAPARE - Administrator, Civil Service (Medical): No    Lack of Transportation (Non-Medical): No  Physical Activity: Inactive (01/12/2023)   Exercise Vital Sign    Days of Exercise per Week: 0 days    Minutes of Exercise per Session: 0 min  Stress: No Stress Concern Present (01/12/2023)    [employer] of Occupational Health - Occupational Stress Questionnaire    Feeling of Stress : Not at all  Social Connections: Moderately Isolated (01/12/2023)   Social Connection and Isolation Panel [NHANES]    Frequency of Communication with Friends and Family: More than three times a week    Frequency of Social Gatherings with Friends and Family: More than three times a week    Attends Religious Services: Never    Database administrator or Organizations: No    Attends Banker Meetings: Never    Marital Status: Married    Tobacco Counseling Counseling given: Not Answered   Clinical Intake:  Pre-visit preparation completed: Yes  Pain : No/denies pain     Nutritional Risks: None  Diabetes: No  How often do you need to have someone help you when you read instructions, pamphlets, or other written materials from your doctor or pharmacy?: 1 - Never  Interpreter Needed?: No  Information entered by :: Renie Ora, LPN   Activities of Daily Living    01/12/2023    2:49 PM 01/12/2023    2:31 PM  In your present state of health, do you have any difficulty performing the following activities:  Hearing? 0 0  Vision? 0 0  Difficulty concentrating or making decisions? 0 0  Walking or climbing stairs? 0 0  Dressing or bathing? 0 0  Doing errands, shopping? 0 0  Preparing Food and eating ? N N  Using the Toilet? N N  In the past six months, have you accidently leaked urine? N N  Do you have problems with loss of bowel control? N N  Managing your Medications? N N  Managing your Finances? N N  Housekeeping or managing your Housekeeping? N N    Patient Care Team: Joaquim Nam, MD as PCP - General (Family Medicine)  Indicate any recent Medical Services you may have received from other than [facility] providers in the past year (date may be approximate).     Assessment:   This is a routine wellness examination for [place].  Hearing/Vision screen Vision  Screening - Comments:: Wears rx glasses - up to date with routine eye exams with  Dr,Bevis    Goals Addressed             This Visit's Progress    Exercise 3x per week (30 min per time)         Depression Screen    01/12/2023    2:48 PM 01/01/2023    9:38 AM 09/25/2022   11:41 AM 05/26/2022   12:10 PM 04/23/2022    3:23 PM 03/15/2020    8:10 AM  PHQ 2/9 Scores  PHQ - 2 Score 0 0 0 0 0 0  PHQ- 9 Score 0 0 0 6      Fall Risk    01/12/2023    2:47 PM 01/12/2023    2:31 PM 01/01/2023    9:37 AM 09/25/2022   11:40 AM 05/26/2022   12:10 PM  Fall Risk   Falls in the past year? 0 0 0 0 0  Number falls in past yr: 0  0 0 0  Injury with Fall? 0  0 0 0  Risk for fall due to : No Fall Risks  No Fall Risks No Fall Risks No Fall Risks  Follow up Falls prevention discussed  Falls evaluation completed Falls evaluation completed Falls evaluation completed    MEDICARE RISK AT HOME: Medicare Risk at Home Any stairs in or around the home?: No If so, are there any without handrails?: No Home free of loose throw rugs in walkways, pet beds, electrical cords, etc?: Yes Adequate lighting in your home to reduce risk of falls?: Yes Life alert?: No Use of a cane, walker or w/c?: No Grab bars in the bathroom?: Yes Shower chair or bench in shower?: Yes Elevated toilet seat or a handicapped toilet?: Yes  TIMED UP AND GO:  Was the test performed? No    Cognitive Function:        01/12/2023    2:49 PM  6CIT Screen  What Year? 0 points  What month? 0 points  What time? 0 points  Count back from 20 0 points  Months in  reverse 0 points  Repeat phrase 0 points  Total Score 0 points    Immunizations Immunization History  Administered Date(s) Administered   PFIZER(Purple Top)SARS-COV-2 Vaccination 01/06/2020, 01/27/2020   Td 07/14/2002   Tdap 12/22/2012, 09/05/2019    TDAP status: Up to date  Flu Vaccine status: Due, Education has been provided regarding the importance of this  vaccine. Advised may receive this vaccine at local pharmacy or Health Dept. Aware to provide a copy of the vaccination record if obtained from local pharmacy or Health Dept. Verbalized acceptance and understanding.  Pneumococcal vaccine status: Due, Education has been provided regarding the importance of this vaccine. Advised may receive this vaccine at local pharmacy or Health Dept. Aware to provide a copy of the vaccination record if obtained from local pharmacy or Health Dept. Verbalized acceptance and understanding.  Covid-19 vaccine status: Declined, Education has been provided regarding the importance of this vaccine but patient still declined. Advised may receive this vaccine at local pharmacy or Health Dept.or vaccine clinic. Aware to provide a copy of the vaccination record if obtained from local pharmacy or Health Dept. Verbalized acceptance and understanding.  Qualifies for Shingles Vaccine? Yes   Zostavax completed No   Shingrix Completed?: No.    Education has been provided regarding the importance of this vaccine. Patient has been advised to call insurance company to determine out of pocket expense if they have not yet received this vaccine. Advised may also receive vaccine at local pharmacy or Health Dept. Verbalized acceptance and understanding.  Screening Tests Health Maintenance  Topic Date Due   Zoster Vaccines- Shingrix (1 of 2) Never done   MAMMOGRAM  11/20/2014   Fecal DNA (Cologuard)  06/17/2021   Pneumonia Vaccine 18+ Years old (1 of 1 - PCV) Never done   DEXA SCAN  Never done   INFLUENZA VACCINE  07/13/2023 (Originally 11/13/2022)   Medicare Annual Wellness (AWV)  01/12/2024   DTaP/Tdap/Td (4 - Td or Tdap) 09/04/2029   Hepatitis C Screening  Completed   HPV VACCINES  Aged Out   Colonoscopy  Discontinued   COVID-19 Vaccine  Discontinued    Health Maintenance  Health Maintenance Due  Topic Date Due   Zoster Vaccines- Shingrix (1 of 2) Never done   MAMMOGRAM   11/20/2014   Fecal DNA (Cologuard)  06/17/2021   Pneumonia Vaccine 41+ Years old (1 of 1 - PCV) Never done   DEXA SCAN  Never done    Colorectal cancer screening: Referral to GI placed declines will discuss with Pcp . Pt aware the office will call re: appt.  Mammogram status: Ordered declines at this time . Pt provided with contact info and advised to call to schedule appt.   Bone Density status: Ordered declines at this time . Pt provided with contact info and advised to call to schedule appt.  Lung Cancer Screening: (Low Dose CT Chest recommended if Age 86-80 years, 20 pack-year currently smoking OR have quit w/in 15years.) does not qualify.   Lung Cancer Screening Referral: n/a  Additional Screening:  Hepatitis C Screening: does not qualify; Completed 11/28/2014  Vision Screening: Recommended annual ophthalmology exams for early detection of glaucoma and other disorders of the eye. Is the patient up to date with their annual eye exam?  Yes  Who is the provider or what is the name of the office in which the patient attends annual eye exams? Dr.bevis  If pt is not established with a provider, would they like to be referred  to a provider to establish care? No .   Dental Screening: Recommended annual dental exams for proper oral hygiene   Community Resource Referral / Chronic Care Management: CRR required this visit?  No   CCM required this visit?  No     Plan:     I have personally reviewed and noted the following in the patient's chart:   Medical and social history Use of alcohol, tobacco or illicit drugs  Current medications and supplements including opioid prescriptions. Patient is not currently taking opioid prescriptions. Functional ability and status Nutritional status Physical activity Advanced directives List of other physicians Hospitalizations, surgeries, and ER visits in previous 12 months Vitals Screenings to include cognitive, depression, and  falls Referrals and appointments  In addition, I have reviewed and discussed with patient certain preventive protocols, quality metrics, and best practice recommendations. A written personalized care plan for preventive services as well as general preventive health recommendations were provided to patient.     Lorrene Reid, LPN   1/61/0960   After Visit Summary: (MyChart) Due to this being a telephonic visit, the after visit summary with patients personalized plan was offered to patient via MyChart   Nurse Notes: Declines all vaccines and health maintenance   updates

## 2023-01-15 ENCOUNTER — Ambulatory Visit: Payer: Medicare PPO

## 2023-01-15 ENCOUNTER — Telehealth: Payer: Self-pay

## 2023-01-15 DIAGNOSIS — E538 Deficiency of other specified B group vitamins: Secondary | ICD-10-CM | POA: Diagnosis not present

## 2023-01-15 MED ORDER — CYANOCOBALAMIN 1000 MCG/ML IJ SOLN
1000.0000 ug | Freq: Once | INTRAMUSCULAR | Status: AC
  Administered 2023-01-15: 1000 ug via INTRAMUSCULAR

## 2023-01-15 NOTE — Progress Notes (Signed)
Patient in office today for teaching on  pts husband giving pt vitamin b 12 injection*. Patient does not have med yet so vitamin b 12 injection med was supplied by office. Printed off Instructions from [employer]. Patient reviewed instruction video on manufactures website Reviewed all instructions with patient and  pts husband and had repeat back to me. Instructed patient and pts husband on proper cleaning of injection site and any signs of infection. Patient's husband was able to administer medication with very little directions. Patient felt comfortable continuing in home setting. Reviewed with patient proper disposal of pen after use. Will call the office if any questions.

## 2023-01-15 NOTE — Telephone Encounter (Signed)
Pt and pts husband came to office for teaching for pt s husband to give her vitamin b 12 injections. Pts husband did well giving injection with instruction and pt tolerated injection well. Pt request vitamin b 12 injectables, syringes and alcohol wipes sent to [facility]. Today was pts 2nd weekly b 12 injection. Pt has 2 more weekly injections and then monthly there after. Thank you.sending note to Dr Para March.

## 2023-01-16 MED ORDER — CYANOCOBALAMIN 1000 MCG/ML IJ SOLN: INTRAMUSCULAR | 3 refills | Status: DC

## 2023-01-16 MED ORDER — "BD ECLIPSE SYRINGE 25G X 1"" 3 ML MISC": 0 refills | Status: AC

## 2023-01-16 NOTE — Telephone Encounter (Signed)
Patient notified rxs sent and to get otc wipes and cotton balls.

## 2023-01-16 NOTE — Telephone Encounter (Signed)
Sent syringes and B12 rxs.  Would get alcohol wipes OTC or use cotton balls/isopropyl alcohol.  Thanks.

## 2023-10-23 ENCOUNTER — Other Ambulatory Visit: Payer: Self-pay | Admitting: Family Medicine

## 2023-11-17 ENCOUNTER — Other Ambulatory Visit: Payer: Self-pay | Admitting: Family Medicine

## 2023-11-18 ENCOUNTER — Ambulatory Visit: Payer: Self-pay

## 2023-11-18 ENCOUNTER — Ambulatory Visit: Admitting: Nurse Practitioner

## 2023-11-18 VITALS — BP 142/70 | HR 65 | Temp 98.3°F | Ht 64.0 in | Wt 152.8 lb

## 2023-11-18 DIAGNOSIS — R1084 Generalized abdominal pain: Secondary | ICD-10-CM | POA: Insufficient documentation

## 2023-11-18 DIAGNOSIS — R112 Nausea with vomiting, unspecified: Secondary | ICD-10-CM | POA: Insufficient documentation

## 2023-11-18 DIAGNOSIS — R197 Diarrhea, unspecified: Secondary | ICD-10-CM | POA: Diagnosis not present

## 2023-11-18 MED ORDER — ONDANSETRON 4 MG PO TBDP: 4.0000 mg | ORAL_TABLET | Freq: Three times a day (TID) | ORAL | 0 refills | Status: DC | PRN

## 2023-11-18 MED ORDER — DICYCLOMINE HCL 10 MG PO CAPS: 10.0000 mg | ORAL_CAPSULE | Freq: Three times a day (TID) | ORAL | 0 refills | Status: DC

## 2023-11-18 NOTE — Assessment & Plan Note (Signed)
 Brat/bland diet push fluids ondansetron  4 mg 3 times daily as needed

## 2023-11-18 NOTE — Telephone Encounter (Signed)
 FYI Only or Action Required?: FYI only for provider.  Patient was last seen in primary care on 01/01/2023 by Cleatus Arlyss RAMAN, MD.  Called Nurse Triage reporting Diarrhea.  Symptoms began several days ago.  Interventions attempted: OTC medications: Tylenol , Imodium.  Symptoms are: diarrhea (2-3 episodes today), vomiting (Monday or Tuesday, none today), decreased appetite, darker colored urine (dark yellow), generalized fatigue  gradually improving.  Triage Disposition: See Physician Within 24 Hours  Patient/caregiver understands and will follow disposition?: Yes                Message from Suzen RAMAN sent at 11/18/2023  2:02 PM EDT  Summary: Possible Food Poisoning   Possible food poisoning. Patient currently experiencing uncontrolled diarrhea and low appetite         Reason for Disposition  [1] MODERATE diarrhea (e.g., 4-6 times / day more than normal) AND [2] present > 48 hours (2 days)  Answer Assessment - Initial Assessment Questions 1. DIARRHEA SEVERITY: How bad is the diarrhea? How many more stools have you had in the past 24 hours than normal?      2-3 episodes of diarrhea in the past 24 hours. She states every time she has a bowel movement she takes Imodium. She states when it first began it was every 5 hours she had diarrhea. She states it has now slowed down.  2. ONSET: When did the diarrhea begin?      Friday night.  3. STOOL DESCRIPTION:  How loose or watery is the diarrhea? What is the stool color? Is there any blood or mucous in the stool?     She states it started out as watery and is now loose. Yellow colored stool and no blood or mucous.  4. VOMITING: Are you also vomiting? If Yes, ask: How many times in the past 24 hours?      Yes, she states she had vomiting Monday or Tuesday. She states she had dry heaves due to she had an empty stomach. No vomiting today.  5. ABDOMEN PAIN: Are you having any abdomen pain? If Yes, ask: What  does it feel like? (e.g., crampy, dull, intermittent, constant)      She states she was having abdominal cramps preceding stools/diarrhea. No pain at this time.  6. ABDOMEN PAIN SEVERITY: If present, ask: How bad is the pain?  (e.g., Scale 1-10; mild, moderate, or severe)     0/10 at this time. She states yesterday was pretty bad day and states the pain was bad enough to let me know I needed to go to the bathroom.  7. ORAL INTAKE: If vomiting, Have you been able to drink liquids? How much liquids have you had in the past 24 hours?     Patient states she was having a hard time with oral intake but today she was able to eat breakfast and lunch. However, she states she woke up this afternoon with another round of diarrhea. She states she is drinking ginger ale, green tea and water. She mentioned she drank some coffee this morning. She mentioned she tried toast and crackers prior to today.  8. HYDRATION: Any signs of dehydration? (e.g., dry mouth [not just dry lips], too weak to stand, dizziness, new weight loss) When did you last urinate?     She states she has lost some weight. She denies any dizziness. She states she has tried to prevent dehydration by drinking fluids. She states she urinates every time she has a bowel movement.  9. EXPOSURE: Have you traveled to a foreign country recently? Have you been exposed to anyone with diarrhea? Could you have eaten any food that was spoiled?     She states she ate chili at [facility] prior to the diarrhea starting. She thinks it may have been food poisoning.  10. ANTIBIOTIC USE: Are you taking antibiotics now or have you taken antibiotics in the past 2 months?       No.  11. OTHER SYMPTOMS: Do you have any other symptoms? (e.g., fever, blood in stool)       Decreased appetite, generalized fatigue (she states she has tried to rest as much as she can today), dark colored urine (she states at times it was darker yellow).  12.  PREGNANCY: Is there any chance you are pregnant? When was your last menstrual period?       N/A.  Protocols used: [facility]

## 2023-11-18 NOTE — Assessment & Plan Note (Signed)
 Can continue Imodium as needed.  Push fluids advance diet as tolerated pending CBC CMP

## 2023-11-18 NOTE — Assessment & Plan Note (Signed)
 Pending CBC CMP and lipase.  Advance diet as tolerated push fluids.  Patient is having a cramping abdominal pain we will do Bentyl  10 mg 3 times daily as needed along with Zofran  4 mg 3 times daily as needed.  Follow-up if no improvement

## 2023-11-18 NOTE — Telephone Encounter (Signed)
 Second attempt: Call cannot be completed at this time, please try your call again later Placed back into call backs       Copied from CRM #[id number]. Topic: Clinical - Pink Word Triage >> Nov 18, 2023  2:01 PM Suzen RAMAN wrote: Reason for Triage: Possible food poisoning. Patient currently experiencing uncontrolled diarrhea and low appetite >> Nov 18, 2023  2:02 PM Suzen RAMAN wrote: Possible food poisoning. Patient currently experiencing uncontrolled diarrhea and low appetite

## 2023-11-18 NOTE — Patient Instructions (Signed)
 Nice to see you today I have sent in some nausea medication to the pharmacy  Advance your diet slowly  Bananas  Rice Applesauce Toast is an option  You can also do broths and jellos

## 2023-11-18 NOTE — Telephone Encounter (Signed)
 Patient called, no answer, mailbox not set up.   Copied from CRM #[id number]. Topic: Clinical - Pink Word Triage >> Nov 18, 2023  2:01 PM Suzen RAMAN wrote: Reason for Triage: Possible food poisoning. Patient currently experiencing uncontrolled diarrhea and low appetite >> Nov 18, 2023  2:02 PM Suzen RAMAN wrote: Possible food poisoning. Patient currently experiencing uncontrolled diarrhea and low appetite

## 2023-11-18 NOTE — Telephone Encounter (Signed)
 This encounter was created in error - please disregard.

## 2023-11-18 NOTE — Progress Notes (Signed)
 Acute Office Visit  Subjective:     Patient ID: Miranda Willis, female    DOB: [birth year], 68 y.o.   MRN: [id number]  Chief Complaint  Patient presents with   Diarrhea    Pt complains of nausea, diarrhea, and vomiting since Friday night. Pt states of cramping pain. Has not had a appetite. Pt is taking imodium to help with diarrhea.     HPI Patient is in today for Abdominal pain  With a history of hypertension, CAD, shingles, OA, vertigo, hypertriglyceridemia, B12 deficiency.   States that her sympotm started on Friday night. States that she had chili from [employer]. States that she had diarrhea with chills that afternoon. States that she was in bed for 2 days. State that she was having cramping and diarrhea. She was using ginger ale and water. She tried yogurt yesterday and vomitited.   States that this morning she woke up and had breakfast and lunch. States that she had the urge to go to bathroom and it was diarrhea.   States that in the beginnin gshe was having it approx 5 times a day. States that it slowed down with the use of immodium.  She has tried tylenol  for the headahce and pain in the stomach   No sick contacts. She is the only one in the house that got ill but was the only one that ate the chili Review of Systems  Constitutional:  Positive for malaise/fatigue. Negative for chills and fever.  Respiratory:  Negative for shortness of breath.   Cardiovascular:  Negative for chest pain.  Gastrointestinal:  Positive for abdominal pain, diarrhea, nausea and vomiting.  Neurological:  Negative for dizziness and headaches.  Psychiatric/Behavioral:  Negative for hallucinations and suicidal ideas.         Objective:    BP (!) 142/70   Pulse 65   Temp 98.3 F (36.8 C) (Oral)   Ht 5' 4 (1.626 m)   Wt 152 lb 12.8 oz (69.3 kg)   SpO2 96%   BMI 26.23 kg/m  BP Readings from Last 3 Encounters:  11/18/23 (!) 142/70  01/01/23 132/70  12/22/22 (!) 174/77   Wt Readings from  Last 3 Encounters:  11/18/23 152 lb 12.8 oz (69.3 kg)  01/12/23 157 lb (71.2 kg)  01/01/23 154 lb (69.9 kg)   SpO2 Readings from Last 3 Encounters:  11/18/23 96%  01/01/23 98%  12/22/22 95%      Physical Exam Vitals and nursing note reviewed.  Constitutional:      Appearance: Normal appearance.  Cardiovascular:     Rate and Rhythm: Normal rate and regular rhythm.     Heart sounds: Murmur heard.  Pulmonary:     Effort: Pulmonary effort is normal.     Breath sounds: Normal breath sounds.  Abdominal:     General: Bowel sounds are normal. There is no distension.     Palpations: There is no mass.     Tenderness: There is no abdominal tenderness.     Hernia: No hernia is present.  Neurological:     Mental Status: She is alert.     No results found for any visits on 11/18/23.      Assessment & Plan:   Problem List Items Addressed This Visit       Digestive   Diarrhea of presumed infectious origin   Can continue Imodium as needed.  Push fluids advance diet as tolerated pending CBC CMP      Relevant Orders  Comprehensive metabolic panel with GFR   CBC with Differential/Platelet   Nausea and vomiting - Primary   Brat/bland diet push fluids ondansetron  4 mg 3 times daily as needed      Relevant Medications   ondansetron  (ZOFRAN -ODT) 4 MG disintegrating tablet     Other   Generalized abdominal pain   Pending CBC CMP and lipase.  Advance diet as tolerated push fluids.  Patient is having a cramping abdominal pain we will do Bentyl  10 mg 3 times daily as needed along with Zofran  4 mg 3 times daily as needed.  Follow-up if no improvement      Relevant Medications   dicyclomine  (BENTYL ) 10 MG capsule   Other Relevant Orders   Comprehensive metabolic panel with GFR   CBC with Differential/Platelet   Lipase    Meds ordered this encounter  Medications   ondansetron  (ZOFRAN -ODT) 4 MG disintegrating tablet    Sig: Take 1 tablet (4 mg total) by mouth every 8 (eight)  hours as needed.    Dispense:  20 tablet    Refill:  0    Supervising Provider:   RANDEEN HARDY A [[id number]]   dicyclomine  (BENTYL ) 10 MG capsule    Sig: Take 1 capsule (10 mg total) by mouth 4 (four) times daily -  before meals and at bedtime.    Dispense:  15 capsule    Refill:  0    Supervising Provider:   RANDEEN HARDY A [[id number]]    Return if symptoms worsen or fail to improve.  Adina Crandall, NP

## 2023-11-18 NOTE — Telephone Encounter (Signed)
 Scheduled in office today.  Appreciate help from [place].

## 2023-11-19 LAB — CBC WITH DIFFERENTIAL/PLATELET
Basophils Absolute: 0 K/uL (ref 0.0–0.1)
Basophils Relative: 0.7 % (ref 0.0–3.0)
Eosinophils Absolute: 0.2 K/uL (ref 0.0–0.7)
Eosinophils Relative: 3.7 % (ref 0.0–5.0)
HCT: 33.1 % — ABNORMAL LOW (ref 36.0–46.0)
Hemoglobin: 11.2 g/dL — ABNORMAL LOW (ref 12.0–15.0)
Lymphocytes Relative: 25.6 % (ref 12.0–46.0)
Lymphs Abs: 1.4 K/uL (ref 0.7–4.0)
MCHC: 33.7 g/dL (ref 30.0–36.0)
MCV: 81.9 fl (ref 78.0–100.0)
Monocytes Absolute: 0.9 K/uL (ref 0.1–1.0)
Monocytes Relative: 17.2 % — ABNORMAL HIGH (ref 3.0–12.0)
Neutro Abs: 2.9 K/uL (ref 1.4–7.7)
Neutrophils Relative %: 52.8 % (ref 43.0–77.0)
Platelets: 301 K/uL (ref 150.0–400.0)
RBC: 4.04 Mil/uL (ref 3.87–5.11)
RDW: 12.7 % (ref 11.5–15.5)
WBC: 5.5 K/uL (ref 4.0–10.5)

## 2023-11-19 LAB — COMPREHENSIVE METABOLIC PANEL WITH GFR
ALT: 13 U/L (ref 0–35)
AST: 14 U/L (ref 0–37)
Albumin: 3.8 g/dL (ref 3.5–5.2)
Alkaline Phosphatase: 58 U/L (ref 39–117)
BUN: 14 mg/dL (ref 6–23)
CO2: 24 meq/L (ref 19–32)
Calcium: 9.2 mg/dL (ref 8.4–10.5)
Chloride: 102 meq/L (ref 96–112)
Creatinine, Ser: 0.78 mg/dL (ref 0.40–1.20)
GFR: 78.91 mL/min (ref >60.00)
Glucose, Bld: 98 mg/dL (ref 70–99)
Potassium: 4.2 meq/L (ref 3.5–5.1)
Sodium: 140 meq/L (ref 135–145)
Total Bilirubin: 0.2 mg/dL (ref 0.2–1.2)
Total Protein: 6.6 g/dL (ref 6.0–8.3)

## 2023-11-19 LAB — LIPASE: Lipase: 13 U/L (ref 11.0–59.0)

## 2023-11-23 ENCOUNTER — Ambulatory Visit: Payer: Self-pay | Admitting: Nurse Practitioner

## 2023-12-09 ENCOUNTER — Other Ambulatory Visit: Payer: Self-pay | Admitting: Family Medicine

## 2023-12-09 DIAGNOSIS — D649 Anemia, unspecified: Secondary | ICD-10-CM

## 2023-12-09 DIAGNOSIS — I10 Essential (primary) hypertension: Secondary | ICD-10-CM

## 2023-12-09 NOTE — Telephone Encounter (Signed)
 E-scribed refill.   Plz schedule annual exam and fasting labs after 01/14/24.

## 2023-12-10 NOTE — Telephone Encounter (Signed)
 Called. No vm set up, sent MyChart

## 2023-12-16 NOTE — Telephone Encounter (Signed)
 Would still need labs.  I put in the follow up lab orders that are not duplicates of recent labs.  Thanks .

## 2023-12-16 NOTE — Addendum Note (Signed)
 Addended by: CLEATUS ARLYSS RAMAN on: 12/16/2023 03:17 PM   Modules accepted: Orders

## 2023-12-16 NOTE — Telephone Encounter (Signed)
 Reached out to advise that she would need to have labs done. She will do them at there appointment.

## 2024-01-14 ENCOUNTER — Ambulatory Visit (INDEPENDENT_AMBULATORY_CARE_PROVIDER_SITE_OTHER): Payer: Medicare PPO

## 2024-01-14 VITALS — Ht 64.0 in | Wt 152.0 lb

## 2024-01-14 DIAGNOSIS — Z1211 Encounter for screening for malignant neoplasm of colon: Secondary | ICD-10-CM

## 2024-01-14 DIAGNOSIS — Z0000 Encounter for general adult medical examination without abnormal findings: Secondary | ICD-10-CM

## 2024-01-14 NOTE — Progress Notes (Signed)
 Please attest and cosign this visit due to patients primary care provider not being in the office at the time the visit was completed.    Subjective:   Miranda Willis is a 68 y.o. who presents for a Medicare Wellness preventive visit.  As a reminder, Annual Wellness Visits don't include a physical exam, and some assessments may be limited, especially if this visit is performed virtually. We may recommend an in-person follow-up visit with your provider if needed.  Visit Complete: Virtual I connected with  Miranda Willis on 01/14/24 by a audio enabled telemedicine application and verified that I am speaking with the correct person using two identifiers.  Patient Location: Home  Provider Location: Office/Clinic  I discussed the limitations of evaluation and management by telemedicine. The patient expressed understanding and agreed to proceed.  Vital Signs: Because this visit was a virtual/telehealth visit, some criteria may be missing or patient reported. Any vitals not documented were not able to be obtained and vitals that have been documented are patient reported.  VideoDeclined- This patient declined Librarian, academic. Therefore the visit was completed with audio only.  Persons Participating in Visit: Patient.  AWV Questionnaire: No: Patient Medicare AWV questionnaire was not completed prior to this visit.  Cardiac Risk Factors include: advanced age (>34men, >10 women);dyslipidemia;hypertension;sedentary lifestyle;smoking/ tobacco exposure     Objective:    Today's Vitals   01/14/24 1124 01/14/24 1125  Weight: 152 lb (68.9 kg)   Height: 5' 4 (1.626 m)   PainSc:  4    Body mass index is 26.09 kg/m.     01/14/2024   11:40 AM 01/12/2023    2:49 PM 03/03/2019    5:52 AM 02/28/2019    8:18 AM  Advanced Directives  Does Patient Have a Medical Advance Directive? No No No No  Would patient like information on creating a medical advance directive?   Yes (MAU/Ambulatory/Procedural Areas - Information given) No - Patient declined     Current Medications (verified) Outpatient Encounter Medications as of 01/14/2024  Medication Sig   etodolac  (LODINE ) 400 MG tablet TAKE 1 TABLET (400 MG TOTAL) BY MOUTH 2 (TWO) TIMES DAILY AS NEEDED (FOR PAIN. TAKE WITH FOOD.).   lisinopril  (ZESTRIL ) 10 MG tablet TAKE 1 TABLET BY MOUTH EVERY DAY   atorvastatin  (LIPITOR) 10 MG tablet Take 1 tablet (10 mg total) by mouth daily. (Patient not taking: Reported on 01/14/2024)   cyanocobalamin  (VITAMIN B12) 1000 MCG/ML injection 1000mcg IM weekly x2 weeks then monthly after that. (Patient not taking: Reported on 01/14/2024)   dicyclomine  (BENTYL ) 10 MG capsule Take 1 capsule (10 mg total) by mouth 4 (four) times daily -  before meals and at bedtime. (Patient not taking: Reported on 01/14/2024)   lidocaine  4 % Place 1 patch onto the skin daily. (Patient not taking: Reported on 01/14/2024)   ondansetron  (ZOFRAN -ODT) 4 MG disintegrating tablet Take 1 tablet (4 mg total) by mouth every 8 (eight) hours as needed. (Patient not taking: Reported on 01/14/2024)   predniSONE  (DELTASONE ) 10 MG tablet Take 2 a day for 5 days, then 1 a day for 5 days, with food. Don't take with aleve/ibuprofen /etodolac . (Patient not taking: Reported on 01/14/2024)   SYRINGE-NEEDLE, DISP, 3 ML (BD ECLIPSE SYRINGE) 25G X 1 3 ML MISC Use with B12 injections. (Patient not taking: Reported on 01/14/2024)   traMADol  (ULTRAM ) 50 MG tablet Take 50 mg by mouth every 12 (twelve) hours as needed for moderate pain (sedation caution). (Patient  not taking: Reported on 01/14/2024)   No facility-administered encounter medications on file as of 01/14/2024.    Allergies (verified) Patient has no known allergies.   History: Past Medical History:  Diagnosis Date   Complication of anesthesia    Hypertension    Murmur    aortic stenosis   Osteoarthritis    PONV (postoperative nausea and vomiting)    Past Surgical  History:  Procedure Laterality Date   ABDOMINAL HYSTERECTOMY     ANTERIOR AND POSTERIOR REPAIR N/A 03/03/2019   Procedure: ANTERIOR (CYSTOCELE) AND POSTERIOR REPAIR (RECTOCELE);  Surgeon: Estelle Service, MD;  Location: [facility];  Service: Gynecology;  Laterality: N/A;   BACK SURGERY  1997   COLONOSCOPY WITH PROPOFOL  N/A 11/18/2022   Procedure: COLONOSCOPY WITH PROPOFOL ;  Surgeon: Therisa Bi, MD;  Location: [facility];  Service: Gastroenterology;  Laterality: N/A;   CYSTOSCOPY N/A 03/03/2019   Procedure: CYSTOSCOPY;  Surgeon: Estelle Service, MD;  Location: [facility];  Service: Gynecology;  Laterality: N/A;   CYSTOSCOPY WITH URETEROSCOPY AND STENT PLACEMENT Left 03/03/2019   Procedure: CYSTOSCOPY WITH URETEROSCOPY AND STENT PLACEMENT;  Surgeon: Estelle Service, MD;  Location: [facility];  Service: Gynecology;  Laterality: Left;   CYSTOSCOPY WITH URETEROSCOPY AND STENT PLACEMENT Left 03/03/2019   Procedure: CYSTOSCOPY WITH URETEROSCOPY AND STENT PLACEMENT;  Surgeon: Sherrilee Belvie CROME, MD;  Location: [facility];  Service: Urology;  Laterality: Left;   DILATION AND CURETTAGE OF UTERUS  1985   due to miscarriage   EYE SURGERY Bilateral 2016   NOSE SURGERY     SPINE SURGERY  06/1996   back surgery L5-S1 Dr Chloe   VAGINAL HYSTERECTOMY N/A 03/03/2019   Procedure: HYSTERECTOMY VAGINAL;  Surgeon: Estelle Service, MD;  Location: [facility];  Service: Gynecology;  Laterality: N/A;   Family History  Problem Relation Age of Onset   Heart disease Mother        MI   Depression Mother    Heart disease Father        MI   Depression Father    Heart disease Maternal Grandmother        MI and CAD   Depression Paternal Grandfather    Colon cancer Neg Hx    Breast cancer Neg Hx    Social History   Socioeconomic History   Marital status: Married    Spouse name: Not on file   Number of children: Not on  file   Years of education: Not on file   Highest education level: Not on file  Occupational History   Not on file  Tobacco Use   Smoking status: Never   Smokeless tobacco: Never  Vaping Use   Vaping status: Never Used  Substance and Sexual Activity   Alcohol use: No    Alcohol/week: 0.0 standard drinks of alcohol   Drug use: No   Sexual activity: Not on file  Other Topics Concern   Not on file  Social History Narrative   Retired from Engineer, petroleum at [employer]   6 grandkids as of 2023   Married 1980   Social Drivers of Corporate investment banker Strain: Low Risk  (01/14/2024)   Overall Financial Resource Strain (CARDIA)    Difficulty of Paying Living Expenses: Not hard at all  Food Insecurity: No Food Insecurity (01/14/2024)   Hunger Vital Sign    Worried About Running Out of Food in the Last Year: Never true  Ran Out of Food in the Last Year: Never true  Transportation Needs: No Transportation Needs (01/14/2024)   PRAPARE - Administrator, Civil Service (Medical): No    Lack of Transportation (Non-Medical): No  Physical Activity: Inactive (01/14/2024)   Exercise Vital Sign    Days of Exercise per Week: 0 days    Minutes of Exercise per Session: 0 min  Stress: No Stress Concern Present (01/14/2024)   [employer] of Occupational Health - Occupational Stress Questionnaire    Feeling of Stress: Only a little  Social Connections: Moderately Isolated (01/14/2024)   Social Connection and Isolation Panel    Frequency of Communication with Friends and Family: More than three times a week    Frequency of Social Gatherings with Friends and Family: More than three times a week    Attends Religious Services: Never    Database administrator or Organizations: No    Attends Engineer, structural: Never    Marital Status: Married    Tobacco Counseling Counseling given: Not Answered   Clinical Intake:  Pre-visit preparation completed: Yes  Pain :  0-10 Pain Score: 4  Pain Location: Shoulder (left side and left knee) Pain Orientation: Right Pain Descriptors / Indicators: Aching Pain Onset: More than a month ago Pain Frequency: Intermittent Pain Relieving Factors: pain relievers  Pain Relieving Factors: pain relievers  BMI - recorded: 26.09 Nutritional Status: BMI 25 -29 Overweight Nutritional Risks: None Diabetes: No  Lab Results  Component Value Date   HGBA1C 6.2 11/11/2021   HGBA1C 6.0 01/11/2019   HGBA1C 6.0 07/17/2017     How often do you need to have someone help you when you read instructions, pamphlets, or other written materials from your doctor or pharmacy?: 1 - Never  Interpreter Needed?: No  Comments: lives with husband Information entered by :: B.Malyssa Maris,LPN   Activities of Daily Living     01/14/2024   11:42 AM  In your present state of health, do you have any difficulty performing the following activities:  Hearing? 0  Vision? 0  Difficulty concentrating or making decisions? 0  Walking or climbing stairs? 0  Dressing or bathing? 0  Doing errands, shopping? 0  Preparing Food and eating ? N  Using the Toilet? N  In the past six months, have you accidently leaked urine? N  Do you have problems with loss of bowel control? N  Managing your Medications? N  Managing your Finances? N  Housekeeping or managing your Housekeeping? N    Patient Care Team: Cleatus Arlyss RAMAN, MD as PCP - General (Family Medicine) Lavonia Lye, MD as Consulting Physician (Ophthalmology)  I have updated your Care Teams any recent Medical Services you may have received from other providers in the past year.     Assessment:   This is a routine wellness examination for Miranda Willis.  Hearing/Vision screen Hearing Screening - Comments:: Patient denies any hearing difficulties.   Vision Screening - Comments:: Pt says their vision is good without glasses Dr  Learta UTD   Goals Addressed             This Visit's  Progress    Exercise 3x per week (30 min per time)   Not on track    Will continue to work on this       Depression Screen     01/14/2024   11:36 AM 01/12/2023    2:48 PM 01/01/2023    9:38 AM 09/25/2022   11:41  AM 05/26/2022   12:10 PM 04/23/2022    3:23 PM 03/15/2020    8:10 AM  PHQ 2/9 Scores  PHQ - 2 Score 0 0 0 0 0 0 0  PHQ- 9 Score  0 0 0 6      Fall Risk     01/14/2024   11:31 AM 11/18/2023    3:32 PM 01/12/2023    2:47 PM 01/12/2023    2:31 PM 01/01/2023    9:37 AM  Fall Risk   Falls in the past year? 1 0 0 0 0  Number falls in past yr: 1 0 0  0  Injury with Fall? 1 0 0  0  Risk for fall due to : No Fall Risks No Fall Risks No Fall Risks  No Fall Risks  Follow up Falls prevention discussed;Education provided Falls evaluation completed Falls prevention discussed  Falls evaluation completed    MEDICARE RISK AT HOME:  Medicare Risk at Home Any stairs in or around the home?: Yes If so, are there any without handrails?: Yes Home free of loose throw rugs in walkways, pet beds, electrical cords, etc?: Yes Adequate lighting in your home to reduce risk of falls?: Yes Life alert?: No Use of a cane, walker or w/c?: No Grab bars in the bathroom?: Yes Shower chair or bench in shower?: Yes Elevated toilet seat or a handicapped toilet?: Yes  TIMED UP AND GO:  Was the test performed?  No  Cognitive Function: 6CIT completed        01/14/2024   11:46 AM 01/12/2023    2:49 PM  6CIT Screen  What Year? 0 points 0 points  What month? 0 points 0 points  What time? 0 points 0 points  Count back from 20 0 points 0 points  Months in reverse 0 points 0 points  Repeat phrase 0 points 0 points  Total Score 0 points 0 points    Immunizations Immunization History  Administered Date(s) Administered   PFIZER(Purple Top)SARS-COV-2 Vaccination 01/06/2020, 01/27/2020   Td 07/14/2002   Tdap 12/22/2012, 09/05/2019    Screening Tests Health Maintenance  Topic Date Due    Pneumococcal Vaccine: 50+ Years (1 of 2 - PCV) Never done   Zoster Vaccines- Shingrix (1 of 2) Never done   Mammogram  11/20/2014   Fecal DNA (Cologuard)  06/17/2021   DEXA SCAN  Never done   Influenza Vaccine  Never done   Medicare Annual Wellness (AWV)  01/13/2025   DTaP/Tdap/Td (4 - Td or Tdap) 09/04/2029   Hepatitis C Screening  Completed   HPV VACCINES  Aged Out   Meningococcal B Vaccine  Aged Out   Colonoscopy  Discontinued   COVID-19 Vaccine  Discontinued    Health Maintenance Items Addressed: None due at this time. Pt will receive vaccines at their pharmcy when decided to obtain Cologard order placed  Additional Screening:  Vision Screening: Recommended annual ophthalmology exams for early detection of glaucoma and other disorders of the eye. Is the patient up to date with their annual eye exam?  Yes  Who is the provider or what is the name of the office in which the patient attends annual eye exams?  Dr Lavonia  Dental Screening: Recommended annual dental exams for proper oral hygiene  Community Resource Referral / Chronic Care Management: CRR required this visit?  No   CCM required this visit?  No   Plan:    I have personally reviewed and noted the following in the  patient's chart:   Medical and social history Use of alcohol, tobacco or illicit drugs  Current medications and supplements including opioid prescriptions. Patient is not currently taking opioid prescriptions. Functional ability and status Nutritional status Physical activity Advanced directives List of other physicians Hospitalizations, surgeries, and ER visits in previous 12 months Vitals Screenings to include cognitive, depression, and falls Referrals and appointments  In addition, I have reviewed and discussed with patient certain preventive protocols, quality metrics, and best practice recommendations. A written personalized care plan for preventive services as well as general preventive  health recommendations were provided to patient.   Erminio LITTIE Saris, LPN   [id number]   After Visit Summary: (MyChart) Due to this being a telephonic visit, the after visit summary with patients personalized plan was offered to patient via MyChart   Notes: Nothing significant to report at this time.

## 2024-01-14 NOTE — Patient Instructions (Addendum)
 Miranda Willis,  Thank you for taking the time for your Medicare Wellness Visit. I appreciate your continued commitment to your health goals. Please review the care plan we discussed, and feel free to reach out if I can assist you further.  Medicare recommends these wellness visits once per year to help you and your care team stay ahead of potential health issues. These visits are designed to focus on prevention, allowing your provider to concentrate on managing your acute and chronic conditions during your regular appointments.  Please note that Annual Wellness Visits do not include a physical exam. Some assessments may be limited, especially if the visit was conducted virtually. If needed, we may recommend a separate in-person follow-up with your provider.  Ongoing Care Seeing your primary care provider every 3 to 6 months helps us  monitor your health and provide consistent, personalized care.   Referrals If a referral was made during today's visit and you haven't received any updates within two weeks, please contact the referred provider directly to check on the status.  An order has been placed for a Cologuard for you. They will mail you the kit with instructions on how to obtain the sample and send it back in to be tested. If you do not received your kit, please call our office and let us  know.    Recommended Screenings:  Health Maintenance  Topic Date Due   Pneumococcal Vaccine for age over 41 (1 of 2 - PCV) Never done   Zoster (Shingles) Vaccine (1 of 2) Never done   Breast Cancer Screening  11/20/2014   Cologuard (Stool DNA test)  06/17/2021   DEXA scan (bone density measurement)  Never done   Flu Shot  Never done   Medicare Annual Wellness Visit  01/13/2025   DTaP/Tdap/Td vaccine (4 - Td or Tdap) 09/04/2029   Hepatitis C Screening  Completed   HPV Vaccine  Aged Out   Meningitis B Vaccine  Aged Out   Colon Cancer Screening  Discontinued   COVID-19 Vaccine  Discontinued        01/12/2023    2:49 PM  Advanced Directives  Does Patient Have a Medical Advance Directive? No  Would patient like information on creating a medical advance directive? Yes (MAU/Ambulatory/Procedural Areas - Information given)   Advance Care Planning is important because it: Ensures you receive medical care that aligns with your values, goals, and preferences. Provides guidance to your family and loved ones, reducing the emotional burden of decision-making during critical moments.  Vision: Annual vision screenings are recommended for early detection of glaucoma, cataracts, and diabetic retinopathy. These exams can also reveal signs of chronic conditions such as diabetes and high blood pressure.  Dental: Annual dental screenings help detect early signs of oral cancer, gum disease, and other conditions linked to overall health, including heart disease and diabetes.  Please see the attached documents for additional preventive care recommendations.

## 2024-01-25 ENCOUNTER — Encounter: Payer: Self-pay | Admitting: Family Medicine

## 2024-01-25 ENCOUNTER — Ambulatory Visit: Admitting: Family Medicine

## 2024-01-25 VITALS — BP 144/76 | HR 63 | Temp 98.3°F | Ht 64.0 in | Wt 161.0 lb

## 2024-01-25 DIAGNOSIS — E538 Deficiency of other specified B group vitamins: Secondary | ICD-10-CM | POA: Diagnosis not present

## 2024-01-25 DIAGNOSIS — R011 Cardiac murmur, unspecified: Secondary | ICD-10-CM

## 2024-01-25 DIAGNOSIS — I10 Essential (primary) hypertension: Secondary | ICD-10-CM | POA: Diagnosis not present

## 2024-01-25 DIAGNOSIS — Z0000 Encounter for general adult medical examination without abnormal findings: Secondary | ICD-10-CM | POA: Diagnosis not present

## 2024-01-25 DIAGNOSIS — M2550 Pain in unspecified joint: Secondary | ICD-10-CM | POA: Diagnosis not present

## 2024-01-25 DIAGNOSIS — I350 Nonrheumatic aortic (valve) stenosis: Secondary | ICD-10-CM

## 2024-01-25 DIAGNOSIS — E781 Pure hyperglyceridemia: Secondary | ICD-10-CM | POA: Diagnosis not present

## 2024-01-25 DIAGNOSIS — Z7189 Other specified counseling: Secondary | ICD-10-CM

## 2024-01-25 DIAGNOSIS — D649 Anemia, unspecified: Secondary | ICD-10-CM | POA: Diagnosis not present

## 2024-01-25 MED ORDER — CYANOCOBALAMIN 1000 MCG/ML IJ SOLN: INTRAMUSCULAR | Status: DC

## 2024-01-25 MED ORDER — ATORVASTATIN CALCIUM 10 MG PO TABS: 10.0000 mg | ORAL_TABLET | Freq: Every day | ORAL | 1 refills | Status: DC

## 2024-01-25 MED ORDER — LISINOPRIL 10 MG PO TABS: 10.0000 mg | ORAL_TABLET | Freq: Every day | ORAL | 1 refills | Status: AC

## 2024-01-25 MED ORDER — ETODOLAC 400 MG PO TABS: ORAL_TABLET | ORAL | 1 refills | Status: AC

## 2024-01-25 MED ORDER — CYANOCOBALAMIN 1000 MCG/ML IJ SOLN: INTRAMUSCULAR | 1 refills | Status: AC

## 2024-01-25 NOTE — Assessment & Plan Note (Signed)
 Continue Etodolac  as is.  Miranda Willis

## 2024-01-25 NOTE — Assessment & Plan Note (Signed)
 Tetanus 2021 Flu encouraged.   PNA d/w pt.   Shingles d/w pt.   Covid vaccine d/w pt.  Mammogram due.  She can call about scheduling.  DXA due, she can check with gynecology.   Pap 2020 Colonoscopy attempted 2024. She has cologuard to complete 2025.  Living will d/w pt.  Husband designated if patient were incapacitated. Diet and exercise.  D/w pt about on both.

## 2024-01-25 NOTE — Progress Notes (Signed)
 Tetanus 2021 Flu encouraged.   PNA d/w pt.   Shingles d/w pt.   Covid vaccine d/w pt.  Mammogram due.  She can call about scheduling.  DXA due, she can check with gynecology.   Pap 2020 Colonoscopy attempted 2024. She has cologuard to complete 2025.  Living will d/w pt.  Husband designated if patient were incapacitated. Diet and exercise.  D/w pt about on both.   Etodolac  helped more than diclofenac .  D/w pt.  Used for back pain.  NSAID cautions discussed with patient.  B12 level to be done in about 3 month- to be done when back on med. Off replacement recently.  She had been off for unrecalled period of time.  D/w pt about use and recheck labs.     Hypertension:               Using medication without problems or lightheadedness: yes Chest pain with exertion:no Edema:no Short of breath: some SOB with exertion.  H/o aortic stenosis noted.   Elevated Cholesterol: Using medications without problems: off med currently.  No ADE while on med.   Muscle aches: no Diet compliance: d/w pt.  Exercise: d/w pt.    Meds, vitals, and allergies reviewed.   ROS: Per HPI unless specifically indicated in ROS section   GEN: nad, alert and oriented HEENT: mucous membranes moist NECK: supple w/o LA CV: rrr.  Soft SEM noted.  PULM: ctab, no inc wob ABD: soft, +bs EXT: no edema SKIN: well perfused.

## 2024-01-25 NOTE — Assessment & Plan Note (Signed)
 Continue lisinopril .  Prev Cr okay.  D/w pt.  Return for other labs in about 3 months.  See AVS.

## 2024-01-25 NOTE — Patient Instructions (Addendum)
 Recheck labs in 3 months. Fasting lab visit.  Please call about scheduling mammogram and bone density test at gynecology clinic.  Take care.  Glad to see you. Let me know if you can't get set up for the echo.

## 2024-01-25 NOTE — Assessment & Plan Note (Signed)
 Unclear if this contributes to occ SOB with exertion.  Would recheck echo, d/w pt.  Ctab on exam.  No CP.

## 2024-01-25 NOTE — Assessment & Plan Note (Signed)
 B12 level to be done in about 3 month- to be done when back on med. Off replacement recently.  She had been off for unrecalled period of time.  D/w pt about use and recheck labs.   She could take dose in the quad muscle if that is more comfortable for patient.

## 2024-01-25 NOTE — Assessment & Plan Note (Signed)
Living will d/w pt.  Husband designated if patient were incapacitated.  

## 2024-01-25 NOTE — Assessment & Plan Note (Signed)
 Restart atorvastatin  and then recheck labs in about 3 months.  See AVS.

## 2024-01-28 DIAGNOSIS — Z1211 Encounter for screening for malignant neoplasm of colon: Secondary | ICD-10-CM | POA: Diagnosis not present

## 2024-02-06 LAB — COLOGUARD: COLOGUARD: NEGATIVE

## 2024-02-08 ENCOUNTER — Ambulatory Visit: Payer: Self-pay | Admitting: Nurse Practitioner

## 2024-02-26 ENCOUNTER — Encounter: Payer: Self-pay | Admitting: Pharmacist

## 2024-02-26 NOTE — Progress Notes (Signed)
 Pharmacy Quality Measure Review  This patient is appearing on a report for being at risk of failing the Controlling Blood Pressure measure this calendar year.   Last documented BP BP Readings from Last 1 Encounters:  01/25/24 (!) 144/76   2025 follow up: YES Cardiology visit 12/24.   Will review BP if documented.   SPC: Pass. Atorvastatin 

## 2024-03-08 ENCOUNTER — Telehealth: Payer: Self-pay | Admitting: Family Medicine

## 2024-03-08 NOTE — Telephone Encounter (Signed)
 Copied from CRM [id number]. Topic: General - Billing Inquiry >> Mar 07, 2024  4:44 PM Alfonso HERO wrote: Reason for CRM: patient received a bill for $20 For a visit that billing is saying its coded incorrectly. Patient trying to find out if she really has a balance or is this an error.

## 2024-03-16 NOTE — Telephone Encounter (Signed)
 Patient reached out to billing, coding is correct, charges accurate for visit, it was a complex visit

## 2024-04-06 ENCOUNTER — Ambulatory Visit

## 2024-04-06 DIAGNOSIS — R011 Cardiac murmur, unspecified: Secondary | ICD-10-CM | POA: Diagnosis not present

## 2024-04-06 LAB — ECHOCARDIOGRAM COMPLETE
AR max vel: 1.02 cm2
AV Area VTI: 1.05 cm2
AV Area mean vel: 1 cm2
AV Mean grad: 24 mmHg
AV Peak grad: 42.3 mmHg
Ao pk vel: 3.25 m/s
Area-P 1/2: 2.99 cm2
S' Lateral: 2.72 cm

## 2024-04-17 ENCOUNTER — Ambulatory Visit: Payer: Self-pay | Admitting: Family Medicine

## 2024-04-17 DIAGNOSIS — I350 Nonrheumatic aortic (valve) stenosis: Secondary | ICD-10-CM

## 2024-04-25 NOTE — Progress Notes (Unsigned)
"  °  Cardiology Office Note   Date:  04/26/2024  ID:  Miranda Willis, DOB Mar 05, 1956, MRN [id number] PCP: Cleatus Arlyss RAMAN, MD  [facility] Providers Cardiologist:  Caron Poser, MD     History of Present Illness Miranda Willis is a 69 y.o. female PMH HTN, HLD who presents for evaluation and management of aortic stenosis.  Patient referred from PCP for progression of aortic stenosis to moderate range.  She notes that she has some occasional dyspnea on exertion.  Denies any angina.  Denies any syncope.  Denies any palpitations.  No orthopnea or lower extremity edema.  Last LDL 117 09/2022.  Relevant CVD History -TTE 03/2024 LVEF 60 to 65%, normal RV size and function, mild MR, moderate aortic stenosis with Vmax 3.25, MG 24, DVI 0.34 with SVI 47. - CAC score 53 LAD 09/2022 (53rd percentile) - TTE 06/2022 Vmax 2.36, MG 12, DVI 0.57    ROS: Pt denies any chest discomfort, jaw pain, arm pain, palpitations, syncope, presyncope, orthopnea, PND, or LE edema.  Studies Reviewed I have independently reviewed the patient's ECG, previous cardiac testing, previous medical records, previous blood work.  Physical Exam VS:  BP (!) 142/84 (BP Location: Right Arm, Patient Position: Sitting, Cuff Size: Large)   Pulse 66   Ht 5' 4 (1.626 m)   Wt 163 lb (73.9 kg)   SpO2 98%   BMI 27.98 kg/m        Wt Readings from Last 3 Encounters:  04/26/24 163 lb (73.9 kg)  01/25/24 161 lb (73 kg)  01/14/24 152 lb (68.9 kg)    GEN: No acute distress. NECK: No JVD; No carotid bruits. CARDIAC: RRR, 2/6 AS murmur rubs, gallops. RESPIRATORY:  Clear to auscultation. EXTREMITIES:  Warm and well-perfused. No edema.  ASSESSMENT AND PLAN Moderate aortic stenosis TTE 03/2024 Vmax 3.25, MG 24, DVI 0.34 with SVI 47.  Progressed from mild 06/2022.  Does have some dyspnea on exertion, but would not expect this from moderate AS.  Will plan serial surveillance for now.  Plan: - Repeat echocardiogram 6 months for  ongoing surveillance; valve/CTS referral once parameters are in severe range  DOE CAC Aortic atherosclerosis Patient reports occasional dyspnea on exertion.  She has a CAC score from 09/2022 that was mild. Possibly CAD versus deconditioning.   Plan: - Coronary CT angiogram to further stratify dyspnea as anginal equivalent; NTG should be tolerable since AS is only moderate. If we can't do this for some reason, then we can get a stress PET instead - Start ASA 81 mg daily - Increase Lipitor to 80 mg daily  HLD LDL 117 09/2022.  Given CAC and aortic atherosclerosis, LDL goal less than 70.  As above, will increase Lipitor to 80 mg daily and recheck next visit.       Dispo: RTC 6 months or sooner PRN  Signed, Caron Poser, MD  "

## 2024-04-26 ENCOUNTER — Ambulatory Visit

## 2024-04-26 VITALS — BP 142/84 | HR 66 | Ht 64.0 in | Wt 163.0 lb

## 2024-04-26 DIAGNOSIS — E782 Mixed hyperlipidemia: Secondary | ICD-10-CM

## 2024-04-26 DIAGNOSIS — Z79899 Other long term (current) drug therapy: Secondary | ICD-10-CM | POA: Diagnosis not present

## 2024-04-26 DIAGNOSIS — R072 Precordial pain: Secondary | ICD-10-CM

## 2024-04-26 DIAGNOSIS — I350 Nonrheumatic aortic (valve) stenosis: Secondary | ICD-10-CM | POA: Diagnosis not present

## 2024-04-26 DIAGNOSIS — I2510 Atherosclerotic heart disease of native coronary artery without angina pectoris: Secondary | ICD-10-CM | POA: Diagnosis not present

## 2024-04-26 DIAGNOSIS — I700 Atherosclerosis of aorta: Secondary | ICD-10-CM | POA: Diagnosis not present

## 2024-04-26 MED ORDER — ASPIRIN 81 MG PO TBEC: 81.0000 mg | DELAYED_RELEASE_TABLET | Freq: Every day | ORAL | Status: AC

## 2024-04-26 MED ORDER — ATORVASTATIN CALCIUM 80 MG PO TABS: 80.0000 mg | ORAL_TABLET | Freq: Every day | ORAL | 3 refills | Status: AC

## 2024-04-26 MED ORDER — METOPROLOL TARTRATE 50 MG PO TABS: ORAL_TABLET | ORAL | 0 refills | Status: AC

## 2024-04-26 NOTE — Patient Instructions (Addendum)
 Medication Instructions:   Your physician recommends the following medication changes.  START TAKING:  Aspirin  81 mg once daily  INCREASE:  Lipitor to 80 mg once daily   Take all other medications as prescribed. *If you need a refill on your cardiac medications before your next appointment, please call your pharmacy*  Lab Work:  Your provider would like for you to have following labs drawn today BMP.    If you have labs (blood work) drawn today and your tests are completely normal, you will receive your results only by: MyChart Message (if you have MyChart) OR A paper copy in the mail If you have any lab test that is abnormal or we need to change your treatment, we will call you to review the results.  Testing/Procedures:  CORONARY CT SCAN:    Your cardiac CT will be scheduled at one of the below locations:   [facility] [address] [place], [place] [address] [phone]  Please arrive 15 mins early for check-in and test prep.  There is spacious parking and easy access to the radiology department from the [facility] entrance. Please enter here and check-in with the desk attendant.   Please follow these instructions carefully (unless otherwise directed):  An IV will be required for this test and Nitroglycerin will be given.   On the Night Before the Test: Be sure to Drink plenty of water. Do not consume any caffeinated/decaffeinated beverages or chocolate 12 hours prior to your test. Do not take any antihistamines 12 hours prior to your test.  On the Day of the Test: Drink plenty of water until 1 hour prior to the test. Do not eat any food 1 hour prior to test. You may take your regular medications prior to the test.  Take metoprolol  (Lopressor ) 50 MG two hours prior to test. (One time dose) FEMALES- please wear underwire-free bra if available, avoid dresses & tight clothing      After the Test: Drink plenty of  water. After receiving IV contrast, you may experience a mild flushed feeling. This is normal. On occasion, you may experience a mild rash up to 24 hours after the test. This is not dangerous. If this occurs, you can take Benadryl 25 mg, Zyrtec, Claritin , or Allegra and increase your fluid intake. (Patients taking Tikosyn should avoid Benadryl, and may take Zyrtec, Claritin , or Allegra) If you experience trouble breathing, this can be serious. If it is severe call 911 IMMEDIATELY. If it is mild, please call our office.  We will call to schedule your test 2-4 weeks out understanding that some insurance companies will need an authorization prior to the service being performed.   For more information and frequently asked questions, please visit our website : [email or web address]  For non-scheduling related questions, please contact the cardiac imaging nurse navigator should you have any questions/concerns: Cardiac Imaging Nurse Navigators Direct Office Dial: [phone]   For scheduling needs, including cancellations and rescheduling, please call Brittany, [phone].    Follow-Up: At [facility], you and your health needs are our priority.  As part of our continuing mission to provide you with exceptional heart care, our providers are all part of one team.  This team includes your primary Cardiologist (physician) and Advanced Practice Providers or APPs (Physician Assistants and Nurse Practitioners) who all work together to provide you with the care you need, when you need it.  Your next appointment:  6 month(s) following a repeat Echocardiogram to  monitor aortic stenosis  Provider:  Caron Poser, MD   We recommend signing up for the patient portal called MyChart.  Sign up information is provided on this After Visit Summary.  MyChart is used to connect with patients for Virtual Visits (Telemedicine).  Patients are able to view lab/test results, encounter notes,  upcoming appointments, etc.  Non-urgent messages can be sent to your provider as well.   To learn more about what you can do with MyChart, go to [email or web address].

## 2024-04-27 ENCOUNTER — Ambulatory Visit: Payer: Self-pay

## 2024-04-27 LAB — BASIC METABOLIC PANEL WITH GFR
BUN/Creatinine Ratio: 24 (ref 12–28)
BUN: 20 mg/dL (ref 8–27)
CO2: 20 mmol/L (ref 20–29)
Calcium: 10 mg/dL (ref 8.7–10.3)
Chloride: 107 mmol/L — ABNORMAL HIGH (ref 96–106)
Creatinine, Ser: 0.84 mg/dL (ref 0.57–1.00)
Glucose: 102 mg/dL — ABNORMAL HIGH (ref 70–99)
Potassium: 5.2 mmol/L (ref 3.5–5.2)
Sodium: 141 mmol/L (ref 134–144)
eGFR: 76 mL/min/1.73

## 2024-05-11 ENCOUNTER — Telehealth (HOSPITAL_COMMUNITY): Payer: Self-pay | Admitting: Emergency Medicine

## 2024-05-11 NOTE — Telephone Encounter (Signed)
 Reaching out to patient to offer assistance regarding upcoming cardiac imaging study; pt verbalizes understanding of appt date/time, parking situation and where to check in, pre-test NPO status and medications ordered, and verified current allergies; name and call back number provided for further questions should they arise Rockwell Alexandria RN Navigator Cardiac Imaging [facility] [phone] office [phone] cell

## 2024-05-12 ENCOUNTER — Ambulatory Visit
Admission: RE | Admit: 2024-05-12 | Discharge: 2024-05-12 | Disposition: A | Discharge status: Home / Self Care | Source: Ambulatory Visit

## 2024-05-12 DIAGNOSIS — I2510 Atherosclerotic heart disease of native coronary artery without angina pectoris: Secondary | ICD-10-CM | POA: Insufficient documentation

## 2024-05-12 DIAGNOSIS — I700 Atherosclerosis of aorta: Secondary | ICD-10-CM | POA: Insufficient documentation

## 2024-05-12 DIAGNOSIS — E782 Mixed hyperlipidemia: Secondary | ICD-10-CM | POA: Diagnosis present

## 2024-05-12 DIAGNOSIS — I350 Nonrheumatic aortic (valve) stenosis: Secondary | ICD-10-CM | POA: Diagnosis present

## 2024-05-12 MED ORDER — IOHEXOL 350 MG/ML SOLN
100.0000 mL | Freq: Once | INTRAVENOUS | Status: AC | PRN
  Administered 2024-05-12: 100 mL via INTRAVENOUS

## 2024-05-12 MED ORDER — NITROGLYCERIN 0.4 MG SL SUBL
0.8000 mg | SUBLINGUAL_TABLET | Freq: Once | SUBLINGUAL | Status: AC
  Administered 2024-05-12: 0.8 mg via SUBLINGUAL
  Filled 2024-05-12: qty 25

## 2024-05-12 NOTE — Progress Notes (Signed)
 Patient tolerated procedure well. W/C to lobby.  Ambulate w/o difficulty. Denies light headedness or being dizzy. Encouraged to drink extra water today and reasoning explained. Verbalized understanding. All questions answered. ABC intact. No further needs. Discharge from procedure area w/o issues.

## 2025-01-17 ENCOUNTER — Ambulatory Visit
